# Patient Record
Sex: Male | Born: 1965 | Race: Black or African American | Hispanic: No | Marital: Married | Smoking: Never smoker
Health system: Southern US, Community
[De-identification: ages and names within clinical notes are randomized; demographics above are authoritative.]

## PROBLEM LIST (undated history)

## (undated) DIAGNOSIS — M1990 Unspecified osteoarthritis, unspecified site: Secondary | ICD-10-CM

## (undated) DIAGNOSIS — T7840XA Allergy, unspecified, initial encounter: Secondary | ICD-10-CM

## (undated) DIAGNOSIS — I10 Essential (primary) hypertension: Secondary | ICD-10-CM

## (undated) DIAGNOSIS — M109 Gout, unspecified: Secondary | ICD-10-CM

## (undated) DIAGNOSIS — E119 Type 2 diabetes mellitus without complications: Secondary | ICD-10-CM

## (undated) DIAGNOSIS — K219 Gastro-esophageal reflux disease without esophagitis: Secondary | ICD-10-CM

## (undated) DIAGNOSIS — G4730 Sleep apnea, unspecified: Secondary | ICD-10-CM

## (undated) DIAGNOSIS — E785 Hyperlipidemia, unspecified: Secondary | ICD-10-CM

## (undated) HISTORY — PX: KNEE ARTHROSCOPY: SUR90

## (undated) HISTORY — DX: Essential (primary) hypertension: I10

## (undated) HISTORY — DX: Sleep apnea, unspecified: G47.30

## (undated) HISTORY — DX: Hyperlipidemia, unspecified: E78.5

## (undated) HISTORY — DX: Allergy, unspecified, initial encounter: T78.40XA

## (undated) HISTORY — DX: Gout, unspecified: M10.9

## (undated) HISTORY — DX: Gastro-esophageal reflux disease without esophagitis: K21.9

## (undated) HISTORY — DX: Type 2 diabetes mellitus without complications: E11.9

---

## 2015-11-07 ENCOUNTER — Telehealth: Payer: Self-pay

## 2015-11-07 NOTE — Telephone Encounter (Signed)
Spoke with pt. He will have either the pharmacy or his last dr to send over a medication list.

## 2015-11-07 NOTE — Telephone Encounter (Signed)
Patient called and wanted to know if his medications could be filled. He has an app on 9/21 to est care. He did not have the name of the medications on him at the time. I informed we could ask. But he may have to go to urgent care to take care of his until his app. Hes other doctor will no fill them. He came from [place]. Please advise or follow up.

## 2015-11-14 ENCOUNTER — Telehealth: Payer: Self-pay | Admitting: Emergency Medicine

## 2015-11-14 MED ORDER — CARVEDILOL 6.25 MG PO TABS: 6.2500 mg | ORAL_TABLET | Freq: Two times a day (BID) | ORAL | Status: DC

## 2015-11-14 MED ORDER — AMLODIPINE BESYLATE 5 MG PO TABS: 5.0000 mg | ORAL_TABLET | Freq: Every day | ORAL | Status: DC

## 2015-11-14 MED ORDER — SAXAGLIPTIN-METFORMIN ER 5-1000 MG PO TB24: ORAL_TABLET | ORAL | Status: DC

## 2015-11-14 MED ORDER — QUINAPRIL HCL 20 MG PO TABS: 20.0000 mg | ORAL_TABLET | Freq: Two times a day (BID) | ORAL | Status: DC

## 2015-11-14 NOTE — Telephone Encounter (Signed)
Yes, ok to fill - just enough to get him to the appt.

## 2015-11-14 NOTE — Telephone Encounter (Signed)
RXs sent to [facility] on file. Pt informed.

## 2015-11-14 NOTE — Telephone Encounter (Signed)
Please advise if you are okay with filling pts meds before being seen in September. Received med list from Pharmacy. Meds entered.

## 2015-11-14 NOTE — Telephone Encounter (Signed)
Received med list from [facility], entered in computer.

## 2015-12-30 ENCOUNTER — Encounter: Payer: Self-pay | Admitting: Internal Medicine

## 2015-12-30 ENCOUNTER — Ambulatory Visit (INDEPENDENT_AMBULATORY_CARE_PROVIDER_SITE_OTHER): Payer: 59 | Admitting: Internal Medicine

## 2015-12-30 DIAGNOSIS — M170 Bilateral primary osteoarthritis of knee: Secondary | ICD-10-CM | POA: Insufficient documentation

## 2015-12-30 DIAGNOSIS — M2550 Pain in unspecified joint: Secondary | ICD-10-CM

## 2015-12-30 DIAGNOSIS — I10 Essential (primary) hypertension: Secondary | ICD-10-CM | POA: Insufficient documentation

## 2015-12-30 DIAGNOSIS — F32A Depression, unspecified: Secondary | ICD-10-CM | POA: Insufficient documentation

## 2015-12-30 DIAGNOSIS — E119 Type 2 diabetes mellitus without complications: Secondary | ICD-10-CM | POA: Diagnosis not present

## 2015-12-30 DIAGNOSIS — G4733 Obstructive sleep apnea (adult) (pediatric): Secondary | ICD-10-CM | POA: Insufficient documentation

## 2015-12-30 DIAGNOSIS — F329 Major depressive disorder, single episode, unspecified: Secondary | ICD-10-CM | POA: Diagnosis not present

## 2015-12-30 MED ORDER — SPIRONOLACTONE 25 MG PO TABS: 25.0000 mg | ORAL_TABLET | Freq: Every day | ORAL | 5 refills | Status: DC

## 2015-12-30 MED ORDER — QUINAPRIL HCL 20 MG PO TABS: 20.0000 mg | ORAL_TABLET | Freq: Two times a day (BID) | ORAL | 5 refills | Status: DC

## 2015-12-30 MED ORDER — AMLODIPINE BESYLATE 5 MG PO TABS: 5.0000 mg | ORAL_TABLET | Freq: Every day | ORAL | 5 refills | Status: DC

## 2015-12-30 MED ORDER — SAXAGLIPTIN-METFORMIN ER 5-1000 MG PO TB24: ORAL_TABLET | ORAL | 5 refills | Status: DC

## 2015-12-30 NOTE — Assessment & Plan Note (Signed)
May need to reevaluate-diagnosis was years ago and at that time he was told he did not need treatment Work on weight loss

## 2015-12-30 NOTE — Assessment & Plan Note (Signed)
Has seen orthopedics in the past-status post bilateral knee arthroscopic Has chronic knee pain and is interested in further evaluation Refer to sports medicine Stressed weight loss Stressed regular exercise

## 2015-12-30 NOTE — Assessment & Plan Note (Signed)
He thinks he has some baseline depression, but wonders if one of his medications is making it worse We'll try switching the Coreg He is interested in seeing a therapist-refer today He would like to hold off on medication for now

## 2015-12-30 NOTE — Assessment & Plan Note (Signed)
Blood pressure elevated here today Advised him to ideally start monitoring regularly He is concerned that Coreg is causing side effects and we will discontinue this Continue amlodipine 5 mg daily Continue Accupril 20 mg twice daily Start spironolactone 20 mg daily Check blood work Follow-up in a few weeks to reassess Decrease sodium intake Start regular exercise and work on weight loss

## 2015-12-30 NOTE — Assessment & Plan Note (Signed)
Check A1c, urine microalbumin Stressed the importance of regular exercise and weight loss She is eating out a lot and stressed the importance of decreasing this and trying to eat more healthy Discussed checking his feet daily and annual eye exams

## 2015-12-30 NOTE — Progress Notes (Signed)
Subjective:    Patient ID: Benjamin Chen, male    DOB: March 03, 1966, 50 y.o.   MRN: [id number]  HPI He is here to establish with a new pcp.   He moved to the area and has not had blood work in over a year.  Hypertension: He is taking his medication daily. He felt better when he did not take it when he initially moved, but continue he had restarted. He has several side effects and thinks it may be related to one or all of his blood pressure medications. He is not compliant with a low sodium diet.  He is not exercising regularly.  He does not monitor his blood pressure at home.    Diabetes: He is taking his medication daily as prescribed. He is somewhat compliant with a diabetic diet. He is not exercising regularly. He monitors his sugars rarely.   He does not check his feet daily.  He is up-to-date with an ophthalmology examination.   Some of his concerns of possible side effects from medication include low libido, depression, joint pain, feeling run down. He is very concerned about the side effects.  Sometimes when he sleeps she wakes up and is not able to breathe. He does feel his throat and was unsure if that this was related to heartburn. He was tested in the past for sleep apnea and was told he had slight sleep apnea and that no treatment was needed. He does have heartburn on occasion.   Medications and allergies reviewed with patient and updated if appropriate.  There are no active problems to display for this patient.   Current Outpatient Prescriptions on File Prior to Visit  Medication Sig Dispense Refill  . amLODipine (NORVASC) 5 MG tablet Take 1 tablet (5 mg total) by mouth daily. 30 tablet 1  . carvedilol (COREG) 6.25 MG tablet Take 1 tablet (6.25 mg total) by mouth 2 (two) times daily with a meal. 60 tablet 1  . quinapril (ACCUPRIL) 20 MG tablet Take 1 tablet (20 mg total) by mouth 2 (two) times daily. 60 tablet 1  . Saxagliptin-Metformin (KOMBIGLYZE XR) 09-998 MG TB24 Take 1  Tablet by Mouth daily with meal 30 tablet 1   No current facility-administered medications on file prior to visit.     Past Medical History:  Diagnosis Date  . Diabetes mellitus without complication ([place])   . Hypertension     History reviewed. No pertinent surgical history.  Social History   Social History  . Marital status: Married    Spouse name: N/A  . Number of children: N/A  . Years of education: N/A   Social History Main Topics  . Smoking status: Never Smoker  . Smokeless tobacco: Never Used  . Alcohol use Yes  . Drug use: No  . Sexual activity: Not Asked   Other Topics Concern  . None   Social History Narrative  . None    Family History  Problem Relation Age of Onset  . Hyperlipidemia Mother   . Hypertension Mother   . Mental illness Father   . Heart disease Maternal Aunt   . Diabetes Maternal Grandmother     Review of Systems  Constitutional: Positive for fatigue (low energy level). Negative for chills and fever.  Eyes: Negative for visual disturbance.  Respiratory: Positive for shortness of breath (with stairs only, not on a flat surface). Negative for cough and wheezing.   Cardiovascular: Positive for chest pain (stabbing in left lateral chest - lasts  few minutes; nuclear stress test done) and palpitations (occasionally). Negative for leg swelling.  Gastrointestinal: Negative for abdominal pain, blood in stool, constipation and diarrhea.       Occ GERD  Genitourinary: Negative for dysuria and hematuria.  Musculoskeletal: Positive for arthralgias (knees, elbows, sometimes toes) and joint swelling (elbow feels inflammed). Negative for back pain.  Neurological: Positive for dizziness (a little), light-headedness (when changes positions) and headaches (occasional).  Psychiatric/Behavioral: Positive for dysphoric mood. The patient is not nervous/anxious.        Objective:   Vitals:   12/30/15 0911  BP: (!) 142/94  Pulse: 88  Resp: 16  Temp: 97.8  F (36.6 C)   Filed Weights   12/30/15 0911  Weight: 283 lb (128.4 kg)   Body mass index is 37.34 kg/m.   Physical Exam Constitutional: He appears well-developed and well-nourished. No distress.  HENT:  Head: Normocephalic and atraumatic.  Right Ear: External ear normal.  Left Ear: External ear normal.  Mouth/Throat: Oropharynx is clear and moist.  Normal ear canals and TM b/l  Eyes: Conjunctivae and EOM are normal.  Neck: Neck supple. No tracheal deviation present. No thyromegaly present.  No carotid bruit  Cardiovascular: Normal rate, regular rhythm, normal heart sounds and intact distal pulses.   No murmur heard. Pulmonary/Chest: Effort normal and breath sounds normal. No respiratory distress. He has no wheezes. He has no rales.  Abdominal: Soft. Bowel sounds are normal. He exhibits no distension. There is no tenderness.  Musculoskeletal: He exhibits no edema.  Lymphadenopathy:    He has no cervical adenopathy.  Skin: Skin is warm and dry. He is not diaphoretic.  Psychiatric: He has a normal mood and affect. His behavior is normal.         Assessment & Plan:   See Problem List for Assessment and Plan of chronic medical problems.

## 2015-12-30 NOTE — Progress Notes (Signed)
Pre visit review using our clinic review tool, if applicable. No additional management support is needed unless otherwise documented below in the visit note. 

## 2015-12-30 NOTE — Assessment & Plan Note (Signed)
Some of his joint pain is likely related to osteoarthritis, but need to rule out autoimmune disease Check ANA, RF, CCP, CRP and ESR

## 2015-12-30 NOTE — Patient Instructions (Signed)
Start regular exercise and work on weight loss.   Get blood work done tomorrow.   Test(s) ordered today. Your results will be released to [place] (or called to you) after review, usually within 72hours after test completion. If any changes need to be made, you will be notified at that same time.  Medications reviewed and updated.  Changes include stopping the carvedilol and start spironolactone 25 mg in the morning.    Your prescription(s) have been submitted to your pharmacy. Please take as directed and contact our office if you believe you are having problem(s) with the medication(s).  A referral for sports medicine, podiatry, a therapist and nutrition.  Please followup in 3 weeks

## 2016-01-17 ENCOUNTER — Other Ambulatory Visit (INDEPENDENT_AMBULATORY_CARE_PROVIDER_SITE_OTHER): Payer: 59

## 2016-01-17 ENCOUNTER — Ambulatory Visit: Payer: 59 | Admitting: Internal Medicine

## 2016-01-17 DIAGNOSIS — E119 Type 2 diabetes mellitus without complications: Secondary | ICD-10-CM | POA: Diagnosis not present

## 2016-01-17 DIAGNOSIS — M2550 Pain in unspecified joint: Secondary | ICD-10-CM

## 2016-01-17 DIAGNOSIS — I10 Essential (primary) hypertension: Secondary | ICD-10-CM | POA: Diagnosis not present

## 2016-01-17 LAB — CBC WITH DIFFERENTIAL/PLATELET
BASOS PCT: 0.4 % (ref 0.0–3.0)
Basophils Absolute: 0 10*3/uL (ref 0.0–0.1)
EOS PCT: 1.4 % (ref 0.0–5.0)
Eosinophils Absolute: 0.1 10*3/uL (ref 0.0–0.7)
HCT: 42.9 % (ref 39.0–52.0)
Hemoglobin: 14.7 g/dL (ref 13.0–17.0)
LYMPHS ABS: 2.4 10*3/uL (ref 0.7–4.0)
Lymphocytes Relative: 34.3 % (ref 12.0–46.0)
MCHC: 34.2 g/dL (ref 30.0–36.0)
MCV: 77.3 fl — AB (ref 78.0–100.0)
MONO ABS: 0.6 10*3/uL (ref 0.1–1.0)
Monocytes Relative: 8.3 % (ref 3.0–12.0)
NEUTROS ABS: 3.9 10*3/uL (ref 1.4–7.7)
NEUTROS PCT: 55.6 % (ref 43.0–77.0)
PLATELETS: 325 10*3/uL (ref 150.0–400.0)
RBC: 5.55 Mil/uL (ref 4.22–5.81)
RDW: 15.3 % (ref 11.5–15.5)
WBC: 6.9 10*3/uL (ref 4.0–10.5)

## 2016-01-17 LAB — COMPREHENSIVE METABOLIC PANEL
ALK PHOS: 71 U/L (ref 39–117)
ALT: 18 U/L (ref 0–53)
AST: 14 U/L (ref 0–37)
Albumin: 3.9 g/dL (ref 3.5–5.2)
BUN: 14 mg/dL (ref 6–23)
CHLORIDE: 105 meq/L (ref 96–112)
CO2: 27 meq/L (ref 19–32)
Calcium: 8.9 mg/dL (ref 8.4–10.5)
Creatinine, Ser: 1.2 mg/dL (ref 0.40–1.50)
GFR: 82.84 mL/min (ref >60.00)
GLUCOSE: 125 mg/dL — AB (ref 70–99)
POTASSIUM: 4 meq/L (ref 3.5–5.1)
SODIUM: 139 meq/L (ref 135–145)
TOTAL PROTEIN: 6.5 g/dL (ref 6.0–8.3)
Total Bilirubin: 0.9 mg/dL (ref 0.2–1.2)

## 2016-01-17 LAB — LIPID PANEL
CHOL/HDL RATIO: 7
Cholesterol: 158 mg/dL (ref 0–200)
HDL: 23.4 mg/dL — AB (ref >39.00)
LDL Cholesterol: 105 mg/dL — ABNORMAL HIGH (ref 0–99)
NonHDL: 134.35
TRIGLYCERIDES: 146 mg/dL (ref 0.0–149.0)
VLDL: 29.2 mg/dL (ref 0.0–40.0)

## 2016-01-17 LAB — TSH: TSH: 2.15 u[IU]/mL (ref 0.35–4.50)

## 2016-01-17 LAB — C-REACTIVE PROTEIN: CRP: 0.8 mg/dL (ref 0.5–20.0)

## 2016-01-17 LAB — HEMOGLOBIN A1C: Hgb A1c MFr Bld: 7.2 % — ABNORMAL HIGH (ref 4.6–6.5)

## 2016-01-17 LAB — MICROALBUMIN / CREATININE URINE RATIO
CREATININE, U: 209.6 mg/dL
MICROALB/CREAT RATIO: 0.6 mg/g (ref 0.0–30.0)
Microalb, Ur: 1.2 mg/dL (ref 0.0–1.9)

## 2016-01-17 LAB — SEDIMENTATION RATE: SED RATE: 1 mm/h (ref 0–15)

## 2016-01-17 LAB — RHEUMATOID FACTOR: Rhuematoid fact SerPl-aCnc: <10 IU/mL (ref <=14)

## 2016-01-20 LAB — ANA: ANA: NEGATIVE

## 2016-01-20 LAB — CYCLIC CITRUL PEPTIDE ANTIBODY, IGG: Cyclic Citrullin Peptide Ab: <16 Units

## 2016-01-23 NOTE — Patient Instructions (Addendum)
  All other Health Maintenance issues reviewed.   All recommended immunizations and age-appropriate screenings are up-to-date or discussed.  No immunizations administered today.   Medications reviewed and updated.  No changes recommended at this time.   Please followup in 6 months, sooner if needed

## 2016-01-23 NOTE — Progress Notes (Signed)
[facility] [place], [place] [address] Phone: [phone] Subjective:    I'm seeing this patient by the request  of:  Binnie Rail, MD   CC: Multiple joint pain, Bilateral knee pain  [id number]  Benjamin Chen is a 50 y.o. male coming in with complaint of joint pains. Patient states He is had this pain for quite some time. Some per Medicare provider and was sent for autoimmune labs as order negative. Past medical history also significant for bilateral knee arthroscopic procedures and has had chronic knee pain since. Patient states that now he is having more and bilateral knee, hip as well as shoulder pain seemed to be worse. Patient states that he just feels that he fatigues too quickly. Affecting some daily activities. States that going up and downstairs has become difficult. Patient's states that also sore at night. Rates severity 7/10.     Past Medical History:  Diagnosis Date  . Diabetes mellitus without complication ([place])   . Hypertension    Past Surgical History:  Procedure Laterality Date  . KNEE ARTHROSCOPY Right    right - torn meniscus  . KNEE ARTHROSCOPY Left    left - torn meniscus   Social History   Social History  . Marital status: Married    Spouse name: N/A  . Number of children: N/A  . Years of education: N/A   Social History Main Topics  . Smoking status: Never Smoker  . Smokeless tobacco: Never Used  . Alcohol use Yes  . Drug use: No  . Sexual activity: Not on file   Other Topics Concern  . Not on file   Social History Narrative  . No narrative on file   Allergies  Allergen Reactions  . Penicillins Swelling   Family History  Problem Relation Age of Onset  . Hyperlipidemia Mother   . Hypertension Mother   . Mental illness Father   . Heart disease Maternal Aunt   . Diabetes Maternal Grandmother     Past medical history, social, surgical and family history all reviewed in electronic medical  record.  No pertanent information unless stated regarding to the chief complaint.   Review of Systems: No headache, visual changes, nausea, vomiting, diarrhea, constipation, dizziness, abdominal pain, skin rash, fevers, chills, night sweats, weight loss, swollen lymph nodes, body aches, joint swelling, muscle aches, chest pain, shortness of breath, mood changes.   Objective  Blood pressure 118/90, pulse 74, height 6\' 1"  (1.854 m), weight 275 lb 12.8 oz (125.1 kg).  General: No apparent distress alert and oriented x3 mood and affect normal, dressed appropriately.  HEENT: Pupils equal, extraocular movements intact  Respiratory: Patient's speak in full sentences and does not appear short of breath  Cardiovascular: No lower extremity edema, non tender, no erythema  Skin: Warm dry intact with no signs of infection or rash on extremities or on axial skeleton.  Abdomen: Soft nontender  Neuro: Cranial nerves II through XII are intact, neurovascularly intact in all extremities with 2+ DTRs and 2+ pulses.  Lymph: No lymphadenopathy of posterior or anterior cervical chain or axillae bilaterally.  Gait normal with good balance and coordination.  MSK:  Non tender with full range of motion and good stability and symmetric strength and tone of shoulders, elbows, wrist, hip and ankles bilaterally. Soreness of all joints but no significant formation. Knee: Bilateral Normal to inspection with no erythema or effusion or obvious bony abnormalities. Generalized soreness.  ROM full in  flexion and extension and lower leg rotation. Ligaments with solid consistent endpoints including ACL, PCL, LCL, MCL. Negative Mcmurray's, Apley's, and Thessalonian tests. Non painful patellar compression. Patellar glide with mild crepitus. Patellar and quadriceps tendons unremarkable. Hamstring and quadriceps strength is normal.       Impression and Recommendations:     This case required medical decision making of moderate  complexity.      Note: This dictation was prepared with Dragon dictation along with smaller phrase technology. Any transcriptional errors that result from this process are unintentional.

## 2016-01-23 NOTE — Progress Notes (Signed)
Subjective:    Patient ID: Benjamin Chen, male    DOB: [birth year], 50 y.o.   MRN: [id number]  HPI The patient is here for follow up.  Joint pain:  We did blood work at his last visit to rule out autoimmune arthritis, which was all negative.  He has seen Dr Tamala Julian and is having additional blood work done.   Hypertension:  He was worried his Coreg was causing side effects and we discontinued it three weeks ago and added spironolactone.  He feels better off the Coreg.  He is taking his medication daily. He is compliant with a low sodium diet.  He denies chest pain, palpitations, edema, shortness of breath and regular headaches. He is not exercising regularly.  He does not monitor his blood pressure at home.  He has had some lightheadedness, but thinks it was related to low sugars from not eating well.  Diabetes: He is taking his medication daily as prescribed. He is not compliant with a diabetic diet. He has not been eating well recently.  He is not exercising regularly. He monitors his sugars and they have been good, but he has had a couple of low sugars, 70.     Medications and allergies reviewed with patient and updated if appropriate.  Patient Active Problem List   Diagnosis Date Noted  . Arthralgia 01/24/2016  . Hyperlipidemia 01/24/2016  . Essential hypertension, benign 12/30/2015  . Diabetes ([place]) 12/30/2015  . OSA (obstructive sleep apnea) 12/30/2015  . Depression 12/30/2015  . Bilateral primary osteoarthritis of knee 12/30/2015  . Joint pain 12/30/2015    Current Outpatient Prescriptions on File Prior to Visit  Medication Sig Dispense Refill  . amLODipine (NORVASC) 5 MG tablet Take 1 tablet (5 mg total) by mouth daily. 30 tablet 5  . quinapril (ACCUPRIL) 20 MG tablet Take 1 tablet (20 mg total) by mouth 2 (two) times daily. 60 tablet 5  . Saxagliptin-Metformin (KOMBIGLYZE XR) 09-998 MG TB24 Take 1 Tablet by Mouth daily with meal 30 tablet 5  . spironolactone (ALDACTONE) 25  MG tablet Take 1 tablet (25 mg total) by mouth daily. 30 tablet 5  . Vitamin D, Ergocalciferol, (DRISDOL) 50000 units CAPS capsule Take 1 capsule (50,000 Units total) by mouth every 7 (seven) days. 12 capsule 0   No current facility-administered medications on file prior to visit.     Past Medical History:  Diagnosis Date  . Diabetes mellitus without complication ([place])   . Hypertension     Past Surgical History:  Procedure Laterality Date  . KNEE ARTHROSCOPY Right    right - torn meniscus  . KNEE ARTHROSCOPY Left    left - torn meniscus    Social History   Social History  . Marital status: Married    Spouse name: N/A  . Number of children: N/A  . Years of education: N/A   Social History Main Topics  . Smoking status: Never Smoker  . Smokeless tobacco: Never Used  . Alcohol use Yes  . Drug use: No  . Sexual activity: Not on file   Other Topics Concern  . Not on file   Social History Narrative  . No narrative on file    Family History  Problem Relation Age of Onset  . Hyperlipidemia Mother   . Hypertension Mother   . Mental illness Father   . Heart disease Maternal Aunt   . Diabetes Maternal Grandmother     Review of Systems  Constitutional: Negative for chills  and fever.  Respiratory: Negative for cough, shortness of breath and wheezing.   Cardiovascular: Negative for chest pain, palpitations and leg swelling.  Musculoskeletal: Positive for arthralgias.  Neurological: Positive for dizziness (occasionally) and light-headedness (occaisonally). Negative for headaches.       Objective:   Vitals:   01/24/16 1116  BP: 106/80  Pulse: 76  Resp: 16  Temp: 97.9 F (36.6 C)   Filed Weights   01/24/16 1116  Weight: 275 lb (124.7 kg)   Body mass index is 36.28 kg/m.   Physical Exam    Constitutional: Appears well-developed and well-nourished. No distress.  HENT:  Head: Normocephalic and atraumatic.  Neck: Neck supple. No tracheal deviation present.  No thyromegaly present.  Cardiovascular: Normal rate, regular rhythm and normal heart sounds.   No murmur heard. No carotid bruit  Pulmonary/Chest: Effort normal and breath sounds normal. No respiratory distress. No has no wheezes. No rales.  Musculoskeletal: No edema.  Lymphadenopathy: No cervical adenopathy.  Skin: Skin is warm and dry. Not diaphoretic.  Psychiatric: Normal mood and affect. Behavior is normal.     Assessment & Plan:    See Problem List for Assessment and Plan of chronic medical problems.   F/u in 6 months

## 2016-01-24 ENCOUNTER — Encounter: Payer: Self-pay | Admitting: Family Medicine

## 2016-01-24 ENCOUNTER — Ambulatory Visit (INDEPENDENT_AMBULATORY_CARE_PROVIDER_SITE_OTHER): Payer: 59 | Admitting: Internal Medicine

## 2016-01-24 ENCOUNTER — Encounter: Payer: Self-pay | Admitting: Internal Medicine

## 2016-01-24 ENCOUNTER — Other Ambulatory Visit (INDEPENDENT_AMBULATORY_CARE_PROVIDER_SITE_OTHER): Payer: 59

## 2016-01-24 ENCOUNTER — Ambulatory Visit (INDEPENDENT_AMBULATORY_CARE_PROVIDER_SITE_OTHER): Payer: 59 | Admitting: Family Medicine

## 2016-01-24 VITALS — BP 118/90 | HR 74 | Ht 73.0 in | Wt 275.8 lb

## 2016-01-24 VITALS — BP 106/80 | HR 76 | Temp 97.9°F | Resp 16 | Wt 275.0 lb

## 2016-01-24 DIAGNOSIS — M2550 Pain in unspecified joint: Secondary | ICD-10-CM | POA: Diagnosis not present

## 2016-01-24 DIAGNOSIS — E785 Hyperlipidemia, unspecified: Secondary | ICD-10-CM

## 2016-01-24 DIAGNOSIS — I10 Essential (primary) hypertension: Secondary | ICD-10-CM

## 2016-01-24 DIAGNOSIS — E119 Type 2 diabetes mellitus without complications: Secondary | ICD-10-CM | POA: Diagnosis not present

## 2016-01-24 DIAGNOSIS — M109 Gout, unspecified: Secondary | ICD-10-CM

## 2016-01-24 LAB — URIC ACID: URIC ACID, SERUM: 8.4 mg/dL — AB (ref 4.0–7.8)

## 2016-01-24 LAB — T3, FREE: T3 FREE: 4.2 pg/mL (ref 2.3–4.2)

## 2016-01-24 LAB — T4, FREE: Free T4: 0.93 ng/dL (ref 0.60–1.60)

## 2016-01-24 LAB — FERRITIN: FERRITIN: 81.1 ng/mL (ref 22.0–322.0)

## 2016-01-24 LAB — VITAMIN D 25 HYDROXY (VIT D DEFICIENCY, FRACTURES): VITD: 19.31 ng/mL — AB (ref 30.00–100.00)

## 2016-01-24 MED ORDER — VITAMIN D (ERGOCALCIFEROL) 1.25 MG (50000 UNIT) PO CAPS: 50000.0000 [IU] | ORAL_CAPSULE | ORAL | 0 refills | Status: DC

## 2016-01-24 NOTE — Assessment & Plan Note (Addendum)
BP on low side today, he has not been monitoring it  He feels lightheadedness is related to low sugars He will start monitoring BP at home -- if low will decrease spironolactone to 12.5 mg daily Work on weight loss - he has lost some weight Start regular exercise Improve diet F/u in 6 months, sooner if needed

## 2016-01-24 NOTE — Assessment & Plan Note (Signed)
Patient is having a significant amount arthralgia. Possibly some mild myositis as well. Patient is a diabetic that does increase the risk of this. Patient has had labs that is ruled out autoimmune diseases or any type of inflammatory response. I would like to check patient's testosterone, uric acid as well as iron and vitamin D that could be also contributing to chronic joint pain. We encouraged patient to stay active. We discussed icing regimen and home exercises. We discussed which activities to do an which was potentially avoid. Patient will come back and see me again in 3-4 weeks.

## 2016-01-24 NOTE — Progress Notes (Signed)
Pre visit review using our clinic review tool, if applicable. No additional management support is needed unless otherwise documented below in the visit note. 

## 2016-01-24 NOTE — Patient Instructions (Addendum)
Good to see you  Lets try some other labs to see what is going on.  Once weekly vitamin D for next 12 weeks.  Turmeric 500mg  twice daily  Tart cherry extract at night any dose.  Stay active, your body wants to move.  See me again in 3-4 weeks and we will see how you are doing.

## 2016-01-24 NOTE — Assessment & Plan Note (Signed)
He is a diabetic and LDL goal is less than 70 - he is not at goal  He is having joint and muscle pain so will hold off on statin for now Stressed improved diet, exercise and weight loss Recheck lipid panel in 6 months

## 2016-01-24 NOTE — Assessment & Plan Note (Signed)
Last a1c 7.2 He is working on improving his diet and weight loss Will try to start exercising Has been referred to nutrition Continue current medication  Advised him to call or return if he has frequent low sugars F/u in 6 months

## 2016-01-26 LAB — TESTOSTERONE, FREE, TOTAL, SHBG
Sex Hormone Binding: 28.3 nmol/L (ref 16.5–55.9)
TESTOSTERONE: 318 ng/dL (ref 264–916)
Testosterone, Free: 14.2 pg/mL (ref 6.8–21.5)

## 2016-01-29 ENCOUNTER — Encounter: Payer: Self-pay | Admitting: Family Medicine

## 2016-01-29 DIAGNOSIS — M109 Gout, unspecified: Secondary | ICD-10-CM | POA: Insufficient documentation

## 2016-01-29 MED ORDER — ALLOPURINOL 100 MG PO TABS: 200.0000 mg | ORAL_TABLET | Freq: Every day | ORAL | 1 refills | Status: DC

## 2016-01-29 NOTE — Progress Notes (Signed)
Entered in error

## 2016-01-30 ENCOUNTER — Ambulatory Visit: Payer: Self-pay | Admitting: Internal Medicine

## 2016-02-24 ENCOUNTER — Encounter: Payer: Self-pay | Admitting: Dietician

## 2016-02-24 ENCOUNTER — Ambulatory Visit (INDEPENDENT_AMBULATORY_CARE_PROVIDER_SITE_OTHER): Payer: 59 | Admitting: Podiatry

## 2016-02-24 ENCOUNTER — Encounter: Payer: 59 | Attending: Internal Medicine | Admitting: Dietician

## 2016-02-24 DIAGNOSIS — E119 Type 2 diabetes mellitus without complications: Secondary | ICD-10-CM

## 2016-02-24 DIAGNOSIS — L853 Xerosis cutis: Secondary | ICD-10-CM

## 2016-02-24 NOTE — Progress Notes (Signed)
   Subjective:    Patient ID: Benjamin Chen, male    DOB: 1966/05/06, 50 y.o.   MRN: [id number]  HPI    Review of Systems  All other systems reviewed and are negative.      Objective:   Physical Exam        Assessment & Plan:

## 2016-02-24 NOTE — Progress Notes (Signed)
Diabetes Self-Management Education  Visit Type: First/Initial  Appt. Start Time: 0830 Appt. End Time: [id number] Patient arrived late  02/24/2016  [name], identified by name and date of birth, is a 50 y.o. male with a diagnosis of Diabetes: Type 2. Labs include A1C of 7.2% 01/17/16, cholesterol 158, triglycerides 146, HDL 23, LDL 105, vitamin D 19.3 on 01/24/16.  Other hx includes HTN, OSA, depression hyperlipidemia, and gout.  Medications include Saxagliptin-Metformin, spironolactone, and vitamin D.  Patient lives with his wife.  I saw her in my office in the past and he was present.  Patient states that his wife has changed many things about her diet to try to eat more healthfully but that they do not have time/energy to cook.  He is resentful about his wife when he does cook that he is the only one doing this and other chores.  They do grocery shop together but he eats most of his meals out.  Patient states that he is going to see someone about depression this week.  I also suggested considering marriage counseling so he and his wife work together more as a team to improve their health  (based on today's conversation and previous visit with his wife).  He moved from [facility] about 1 year ago.  7 years ago he was 238 lbs and lost this by walking and eating lighter but regained this due to increased junk food intake when he became depressed.  He works as a Medical illustrator.  He does walk his dog and has an exercise bike but does not use.  There is also a gym in the apartment complex but he does not use this.    ASSESSMENT  Height 6' (1.829 m), weight 276 lb (125.2 kg). Body mass index is 37.43 kg/m.      Diabetes Self-Management Education - 02/24/16 0829      Visit Information   Visit Type First/Initial     Initial Visit   Diabetes Type Type 2   Are you currently following a meal plan? No   Are you taking your medications as prescribed? Yes   Date Diagnosed 10-15 years ago     Health Coping   How would you rate your overall health? Good     Psychosocial Assessment   Patient Belief/Attitude about Diabetes Motivated to manage diabetes  but burned out and don't want it anymore   Self-care barriers None   Self-management support Doctor's office   Other persons present Patient   Patient Concerns Nutrition/Meal planning;Glycemic Control   Special Needs None   Preferred Learning Style No preference indicated   Learning Readiness Ready   How often do you need to have someone help you when you read instructions, pamphlets, or other written materials from your doctor or pharmacy? 1 - Never   What is the last grade level you completed in school? 2 years college     Pre-Education Assessment   Patient understands the diabetes disease and treatment process. Needs Review   Patient understands incorporating nutritional management into lifestyle. Needs Instruction   Patient undertands incorporating physical activity into lifestyle. Needs Review   Patient understands using medications safely. Demonstrates understanding / competency   Patient understands monitoring blood glucose, interpreting and using results Needs Review   Patient understands prevention, detection, and treatment of acute complications. Needs Review   Patient understands prevention, detection, and treatment of chronic complications. Needs Review   Patient understands how to develop strategies to address psychosocial issues. Needs  Review   Patient understands how to develop strategies to promote health/change behavior. Needs Review     Complications   Last HgB A1C per patient/outside source 7.2 %  01/17/16   How often do you check your blood sugar? 0 times/day (not testing)  "If I don't know then it won't hurt me."   Have you had a dilated eye exam in the past 12 months? No  "due for it"   Have you had a dental exam in the past 12 months? Yes   Are you checking your feet? Yes   How many days per week are  you checking your feet? 7     Dietary Intake   Breakfast skips at times OR instant oatmeal and tangerine or apple and Coffee with powdered cream and splenda or occasional sugar   Snack (morning) "what ever is laying around at work" OR Biomedical engineer (chips, candy)   Lunch leftovers OR cafe ("salty special of the day")   Snack (afternoon) onlyl if he did not have a morning snack   Dinner meat or fish with fries, vegetables OR [place] OR fish sticks and crab cakes OR [place] food OR burger and fries   Snack (evening) chips or humus or cookies if it is there   Beverage(s) sweet tea, diet tea, coffee with splenda or sugar and cream, water, juice, diet soda     Exercise   Exercise Type ADL's  gym in apartment complex   How many days per week to you exercise? 0   How many minutes per day do you exercise? 0   Total minutes per week of exercise 0     Patient Education   Previous Diabetes Education Yes (please comment)  several years ago and can't remember   Disease state  Definition of diabetes, type 1 and 2, and the diagnosis of diabetes;Factors that contribute to the development of diabetes;Explored patient's options for treatment of their diabetes   Nutrition management  Role of diet in the treatment of diabetes and the relationship between the three main macronutrients and blood glucose level;Food label reading, portion sizes and measuring food.;Information on hints to eating out and maintain blood glucose control.;Meal options for control of blood glucose level and chronic complications.   Physical activity and exercise  Role of exercise on diabetes management, blood pressure control and cardiac health.   Monitoring Purpose and frequency of SMBG.;Identified appropriate SMBG and/or A1C goals.;Yearly dilated eye exam;Daily foot exams   Acute complications Taught treatment of hypoglycemia - the 15 rule.   Chronic complications Relationship between chronic complications and blood glucose  control;Assessed and discussed foot care and prevention of foot problems;Retinopathy and reason for yearly dilated eye exams   Psychosocial adjustment Role of stress on diabetes;Worked with patient to identify barriers to care and solutions;Helped patient identify a support system for diabetes management;Identified and addressed patients feelings and concerns about diabetes;Brainstormed with patient on coping mechanisms for social situations, getting support from significant others, dealing with feelings about diabetes   Personal strategies to promote health Lifestyle issues that need to be addressed for better diabetes care     Individualized Goals (developed by patient)   Nutrition General guidelines for healthy choices and portions discussed   Physical Activity Exercise 5-7 days per week;30 minutes per day   Medications take my medication as prescribed   Monitoring  test my blood glucose as discussed   Reducing Risk examine blood glucose patterns;do foot checks daily     Post-Education Assessment  Patient understands the diabetes disease and treatment process. Demonstrates understanding / competency   Patient understands incorporating nutritional management into lifestyle. Demonstrates understanding / competency   Patient undertands incorporating physical activity into lifestyle. Demonstrates understanding / competency   Patient understands using medications safely. Demonstrates understanding / competency   Patient understands monitoring blood glucose, interpreting and using results Demonstrates understanding / competency   Patient understands prevention, detection, and treatment of acute complications. Demonstrates understanding / competency   Patient understands prevention, detection, and treatment of chronic complications. Demonstrates understanding / competency   Patient understands how to develop strategies to address psychosocial issues. Demonstrates understanding / competency   Patient  understands how to develop strategies to promote health/change behavior. Demonstrates understanding / competency     Outcomes   Expected Outcomes Demonstrated interest in learning. Expect positive outcomes   Future DMSE PRN   Program Status Completed      Individualized Plan for Diabetes Self-Management Training:   Learning Objective:  Patient will have a greater understanding of diabetes self-management. Patient education plan is to attend individual and/or group sessions per assessed needs and concerns.   Plan:   Patient Instructions  Be as active as possible.  Aim for 30 minutes most days.  Bike, walk, go to the gym. Make healthy choices, eat slowly, stop when you are satisfied. Before eating a snack ask, "Am I hungry?"  If you aren't, what could you do instead. Rethink what you drink. Consider planning easy meals. Avoid skipping meals.  Aim for 3-4 Carb Choices per meal (45-60 grams) +/- 1 either way  Aim for 0-15 Carbs per snack if hungry  Include protein in moderation with your meals and snacks Consider reading food labels for Total Carbohydrate and Fat Grams of foods Consider checking BG at alternate times per day as directed by MD  Consider taking medication as directed by MD    Expected Outcomes:  Demonstrated interest in learning. Expect positive outcomes  Education material provided: Food label handouts, A1C conversion sheet, Meal plan card, My Plate and Snack sheet  If problems or questions, patient to contact team via:  Phone and Email  Future DSME appointment: PRN

## 2016-02-24 NOTE — Patient Instructions (Signed)
Be as active as possible.  Aim for 30 minutes most days.  Bike, walk, go to the gym. Make healthy choices, eat slowly, stop when you are satisfied. Before eating a snack ask, "Am I hungry?"  If you aren't, what could you do instead. Rethink what you drink. Consider planning easy meals. Avoid skipping meals.  Aim for 3-4 Carb Choices per meal (45-60 grams) +/- 1 either way  Aim for 0-15 Carbs per snack if hungry  Include protein in moderation with your meals and snacks Consider reading food labels for Total Carbohydrate and Fat Grams of foods Consider checking BG at alternate times per day as directed by MD  Consider taking medication as directed by MD

## 2016-02-25 ENCOUNTER — Ambulatory Visit (INDEPENDENT_AMBULATORY_CARE_PROVIDER_SITE_OTHER): Payer: 59 | Admitting: Licensed Clinical Social Worker

## 2016-02-25 DIAGNOSIS — F331 Major depressive disorder, recurrent, moderate: Secondary | ICD-10-CM | POA: Diagnosis not present

## 2016-03-01 NOTE — Progress Notes (Signed)
Patient ID: Benjamin Chen, male   DOB: 09/23/1965, 50 y.o.   MRN: [id number] Subjective:  Patient presents today for lower extremity evaluation and exam. Patient states that he was diagnosed with diabetes approximately 15 years ago. Patient presents today for routine diabetic lower extremity exam.    Objective/Physical Exam General: The patient is alert and oriented x3 in no acute distress.  Dermatology: Xerosis noted to the bilateral lower extremity feet with mild hyperkeratotic skin. Skin is warm, dry and supple bilateral lower extremities. Negative for open lesions or macerations.  Vascular: Palpable pedal pulses bilaterally. No edema or erythema noted. Capillary refill within normal limits.  Neurological: Epicritic and protective threshold grossly intact bilaterally.   Musculoskeletal Exam: Range of motion within normal limits to all pedal and ankle joints bilateral. Muscle strength 5/5 in all groups bilateral.   Radiographic Exam:  Normal osseous mineralization. Joint spaces preserved. No fracture/dislocation/boney destruction.    Assessment: #1 diabetes mellitus type II #2 xerosis bilateral feet   Plan of Care:  #1 Patient was evaluated. #2 patient satisfactory in all aspects of musculoskeletal, vascular, dermatological and neurological exam. #3 recommend AmLactin cream #4 patient is to return to the clinic annually for routine lower extremity diabetic exam.   Dr. Edrick Kins, [place]

## 2016-04-02 ENCOUNTER — Other Ambulatory Visit: Payer: Self-pay | Admitting: Family Medicine

## 2016-04-06 NOTE — Telephone Encounter (Signed)
Refill done.  

## 2016-05-03 ENCOUNTER — Other Ambulatory Visit: Payer: Self-pay | Admitting: Family Medicine

## 2016-06-04 ENCOUNTER — Other Ambulatory Visit: Payer: Self-pay | Admitting: Internal Medicine

## 2016-07-13 ENCOUNTER — Other Ambulatory Visit: Payer: Self-pay | Admitting: Internal Medicine

## 2016-07-14 ENCOUNTER — Other Ambulatory Visit: Payer: Self-pay | Admitting: Internal Medicine

## 2016-08-02 ENCOUNTER — Other Ambulatory Visit: Payer: Self-pay | Admitting: Family Medicine

## 2016-08-04 ENCOUNTER — Other Ambulatory Visit: Payer: Self-pay | Admitting: Internal Medicine

## 2016-09-07 ENCOUNTER — Other Ambulatory Visit: Payer: Self-pay | Admitting: Internal Medicine

## 2016-09-12 ENCOUNTER — Other Ambulatory Visit: Payer: Self-pay | Admitting: Internal Medicine

## 2016-09-20 ENCOUNTER — Other Ambulatory Visit: Payer: Self-pay | Admitting: Internal Medicine

## 2016-10-02 ENCOUNTER — Other Ambulatory Visit: Payer: Self-pay | Admitting: Family Medicine

## 2016-10-02 ENCOUNTER — Other Ambulatory Visit: Payer: Self-pay | Admitting: Internal Medicine

## 2016-10-06 ENCOUNTER — Other Ambulatory Visit: Payer: Self-pay | Admitting: Family Medicine

## 2016-10-06 MED ORDER — VITAMIN D (ERGOCALCIFEROL) 1.25 MG (50000 UNIT) PO CAPS: 50000.0000 [IU] | ORAL_CAPSULE | ORAL | 0 refills | Status: DC

## 2016-10-06 MED ORDER — AMLODIPINE BESYLATE 5 MG PO TABS: 5.0000 mg | ORAL_TABLET | Freq: Every day | ORAL | 0 refills | Status: DC

## 2016-10-06 MED ORDER — SAXAGLIPTIN-METFORMIN ER 5-1000 MG PO TB24: ORAL_TABLET | ORAL | 0 refills | Status: DC

## 2016-10-07 ENCOUNTER — Ambulatory Visit (INDEPENDENT_AMBULATORY_CARE_PROVIDER_SITE_OTHER): Payer: BLUE CROSS/BLUE SHIELD | Admitting: Internal Medicine

## 2016-10-07 ENCOUNTER — Other Ambulatory Visit (INDEPENDENT_AMBULATORY_CARE_PROVIDER_SITE_OTHER): Payer: BLUE CROSS/BLUE SHIELD

## 2016-10-07 ENCOUNTER — Other Ambulatory Visit: Payer: Self-pay | Admitting: *Deleted

## 2016-10-07 ENCOUNTER — Encounter: Payer: Self-pay | Admitting: Internal Medicine

## 2016-10-07 VITALS — BP 144/98 | HR 75 | Temp 98.6°F | Resp 16 | Ht 72.0 in | Wt 288.0 lb

## 2016-10-07 DIAGNOSIS — N529 Male erectile dysfunction, unspecified: Secondary | ICD-10-CM

## 2016-10-07 DIAGNOSIS — G4733 Obstructive sleep apnea (adult) (pediatric): Secondary | ICD-10-CM | POA: Diagnosis not present

## 2016-10-07 DIAGNOSIS — E119 Type 2 diabetes mellitus without complications: Secondary | ICD-10-CM

## 2016-10-07 DIAGNOSIS — I10 Essential (primary) hypertension: Secondary | ICD-10-CM

## 2016-10-07 DIAGNOSIS — R079 Chest pain, unspecified: Secondary | ICD-10-CM | POA: Diagnosis not present

## 2016-10-07 DIAGNOSIS — M109 Gout, unspecified: Secondary | ICD-10-CM | POA: Diagnosis not present

## 2016-10-07 DIAGNOSIS — E7800 Pure hypercholesterolemia, unspecified: Secondary | ICD-10-CM

## 2016-10-07 MED ORDER — QUINAPRIL HCL 20 MG PO TABS: 20.0000 mg | ORAL_TABLET | Freq: Two times a day (BID) | ORAL | 3 refills | Status: DC

## 2016-10-07 MED ORDER — AMLODIPINE BESYLATE 5 MG PO TABS: 5.0000 mg | ORAL_TABLET | Freq: Every day | ORAL | 3 refills | Status: DC

## 2016-10-07 MED ORDER — SPIRONOLACTONE 25 MG PO TABS: 25.0000 mg | ORAL_TABLET | Freq: Every day | ORAL | 3 refills | Status: DC

## 2016-10-07 MED ORDER — SAXAGLIPTIN-METFORMIN ER 5-1000 MG PO TB24: ORAL_TABLET | ORAL | 1 refills | Status: DC

## 2016-10-07 NOTE — Assessment & Plan Note (Signed)
No flares Check uric acid level Continue allopurinol

## 2016-10-07 NOTE — Patient Instructions (Addendum)
BP goal is < 130/80.  Monitor at home regularly.  Call or return if not controlled.   Look into the weight loss management program: [email or web address]   Test(s) ordered today. Your results will be released to Benjamin Chen (or called to you) after review, usually within 72hours after test completion. If any changes need to be made, you will be notified at that same time.    Medications reviewed and updated.  No changes recommended at this time.  Your prescription(s) have been submitted to your pharmacy. Please take as directed and contact our office if you believe you are having problem(s) with the medication(s).  A referral was ordered for cardiology, urology and pulmonary for sleep apnea    Please followup in 3 months

## 2016-10-07 NOTE — Assessment & Plan Note (Signed)
Not fasting Will hold off on checking Work on lifestyle Lipids only slightly elevated

## 2016-10-07 NOTE — Assessment & Plan Note (Signed)
Having choking episodes at night while sleeping Need to re-evaluate OSA Refer to pulm

## 2016-10-07 NOTE — Progress Notes (Signed)
Subjective:    Patient ID: Benjamin Chen, male    DOB: 05/19/1965, 51 y.o.   MRN: [id number]  HPI The patient is here for follow up.  Diabetes: He is taking his medication daily as prescribed. He is not always compliant with a diabetic diet - he eats out too much. He is not exercising regularly.   Hypertension: He is not taking his medication daily - he has not taken it for a few days - he needs refill. He is compliant with a low sodium diet.  He denies  palpitations, edema, regular shortness of breath and regular headaches. He is not exercising regularly.  He does monitor his blood pressure at home occasionally  - it was better than here now - he thinks it was controlled.    H/o gout:  He is taking allopurinol daily.  He denies flares.    He has had episodes of left sided chest pain that was associated with shortness of breath, headache.  He denies lightheadedness and palpitations.  This occurs occasionally.  It can occur at rest. It occurs more frequently.  He had a nuclear stress test at that time, but never followed up and does not know the results.  It can last 10 sec to longer.   GERD:  He eats certain foods he will have reflux/GERD and will throw up.  Then he is fine. It feels like it may get stuck in his esophagus.  He can continue to eat.  He does have GERD when laying down or eating a certain foods - this occurs only 3/month.     1-2 times a year when he sleeps he wakes up and can't breath - he is drowning in his saliva.  He has known mild sleep apnea.  He was told he did not need cpap.   Medications and allergies reviewed with patient and updated if appropriate.  Patient Active Problem List   Diagnosis Date Noted  . Gout 01/29/2016  . Arthralgia 01/24/2016  . Hyperlipidemia 01/24/2016  . Essential hypertension, benign 12/30/2015  . Diabetes ([place]) 12/30/2015  . OSA (obstructive sleep apnea) 12/30/2015  . Depression 12/30/2015  . Bilateral primary osteoarthritis of knee  12/30/2015  . Joint pain 12/30/2015    Current Outpatient Prescriptions on File Prior to Visit  Medication Sig Dispense Refill  . allopurinol (ZYLOPRIM) 100 MG tablet TAKE 2 TABLETS BY MOUTH EVERY DAY 60 tablet 5  . amLODipine (NORVASC) 5 MG tablet Take 1 tablet (5 mg total) by mouth daily. Must keep May 30th appt for future refills 30 tablet 0  . quinapril (ACCUPRIL) 20 MG tablet TAKE 1 TABLET BY MOUTH TWICE DAILY. 60 tablet 5  . Saxagliptin-Metformin (KOMBIGLYZE XR) 09-998 MG TB24 Take 1 tablet every day with meal Must keep May 30th appt for future refills 30 tablet 0  . spironolactone (ALDACTONE) 25 MG tablet TAKE 1 TABLET BY MOUTH DAILY. 30 tablet 2  . TURMERIC CURCUMIN PO Take by mouth.    . Vitamin D, Ergocalciferol, (DRISDOL) 50000 units CAPS capsule Take 1 capsule (50,000 Units total) by mouth every 7 (seven) days. 12 capsule 0   No current facility-administered medications on file prior to visit.     Past Medical History:  Diagnosis Date  . Diabetes mellitus without complication ([place])   . Hyperlipidemia   . Hypertension     Past Surgical History:  Procedure Laterality Date  . KNEE ARTHROSCOPY Right    right - torn meniscus  . KNEE ARTHROSCOPY  Left    left - torn meniscus    Social History   Social History  . Marital status: Married    Spouse name: N/A  . Number of children: N/A  . Years of education: N/A   Social History Main Topics  . Smoking status: Never Smoker  . Smokeless tobacco: Never Used  . Alcohol use Yes  . Drug use: No  . Sexual activity: Not on file   Other Topics Concern  . Not on file   Social History Narrative  . No narrative on file    Family History  Problem Relation Age of Onset  . Hyperlipidemia Mother   . Hypertension Mother   . Mental illness Father   . Heart disease Maternal Aunt   . Diabetes Maternal Grandmother     Review of Systems  Constitutional: Negative for chills and fever.  Respiratory: Positive for shortness of  breath (occurs with chest pain). Negative for cough and wheezing.   Cardiovascular: Positive for chest pain (left sided with episodes). Negative for palpitations and leg swelling.  Neurological: Positive for headaches (with episodes). Negative for light-headedness.       Objective:   Vitals:   10/07/16 1621  BP: (!) 144/98  Pulse: 75  Resp: 16  Temp: 98.6 F (37 C)   Wt Readings from Last 3 Encounters:  10/07/16 288 lb (130.6 kg)  02/24/16 276 lb (125.2 kg)  01/24/16 275 lb (124.7 kg)   Body mass index is 39.06 kg/m.   Physical Exam    Constitutional: Appears well-developed and well-nourished. No distress.  HENT:  Head: Normocephalic and atraumatic.  Neck: Neck supple. No tracheal deviation present. No thyromegaly present.  No cervical lymphadenopathy Cardiovascular: Normal rate, regular rhythm and normal heart sounds.   No murmur heard. No carotid bruit .  No edema Pulmonary/Chest: Effort normal and breath sounds normal. No respiratory distress. No has no wheezes. No rales.  Skin: Skin is warm and dry. Not diaphoretic.  Psychiatric: Normal mood and affect. Behavior is normal.      Assessment & Plan:    See Problem List for Assessment and Plan of chronic medical problems.

## 2016-10-07 NOTE — Assessment & Plan Note (Signed)
Episodes of left sided chest pain assoc with sob and ha's Had testing years ago - results unknown Refer to cardiology

## 2016-10-07 NOTE — Assessment & Plan Note (Signed)
Check a1c Low sugar / carb diet Stressed regular exercise,  weight loss F/u in 3 months

## 2016-10-07 NOTE — Assessment & Plan Note (Signed)
?   Controlled - has been off of medications Restart meds Monitor BP at home - goal < 130/80 Stressed exercise, weight loss cmp

## 2016-10-07 NOTE — Assessment & Plan Note (Signed)
He wonders about his testosterone level - it was on the low side, but not done first thing in the morning - also not likely the cause of ED Will refer to urology for further evaluation Stressed good BP, DM control and weight loss

## 2016-10-08 ENCOUNTER — Encounter: Payer: Self-pay | Admitting: Internal Medicine

## 2016-10-08 LAB — COMPREHENSIVE METABOLIC PANEL
ALBUMIN: 4.2 g/dL (ref 3.5–5.2)
ALT: 28 U/L (ref 0–53)
AST: 19 U/L (ref 0–37)
Alkaline Phosphatase: 91 U/L (ref 39–117)
BUN: 16 mg/dL (ref 6–23)
CALCIUM: 9.9 mg/dL (ref 8.4–10.5)
CHLORIDE: 101 meq/L (ref 96–112)
CO2: 29 mEq/L (ref 19–32)
Creatinine, Ser: 1.18 mg/dL (ref 0.40–1.50)
GFR: 84.21 mL/min (ref >60.00)
Glucose, Bld: 123 mg/dL — ABNORMAL HIGH (ref 70–99)
POTASSIUM: 4.4 meq/L (ref 3.5–5.1)
SODIUM: 137 meq/L (ref 135–145)
Total Bilirubin: 0.6 mg/dL (ref 0.2–1.2)
Total Protein: 7.2 g/dL (ref 6.0–8.3)

## 2016-10-08 LAB — HEMOGLOBIN A1C: HEMOGLOBIN A1C: 7.6 % — AB (ref 4.6–6.5)

## 2016-10-08 LAB — URIC ACID: URIC ACID, SERUM: 5.5 mg/dL (ref 4.0–7.8)

## 2016-10-09 NOTE — Telephone Encounter (Signed)
Discussed this with pt over the phone

## 2016-10-20 ENCOUNTER — Ambulatory Visit (INDEPENDENT_AMBULATORY_CARE_PROVIDER_SITE_OTHER): Payer: BLUE CROSS/BLUE SHIELD | Admitting: Cardiovascular Disease

## 2016-10-20 ENCOUNTER — Encounter: Payer: Self-pay | Admitting: Cardiovascular Disease

## 2016-10-20 VITALS — BP 130/100 | HR 76 | Ht 72.0 in | Wt 270.0 lb

## 2016-10-20 DIAGNOSIS — I10 Essential (primary) hypertension: Secondary | ICD-10-CM

## 2016-10-20 DIAGNOSIS — R079 Chest pain, unspecified: Secondary | ICD-10-CM

## 2016-10-20 DIAGNOSIS — G4733 Obstructive sleep apnea (adult) (pediatric): Secondary | ICD-10-CM | POA: Diagnosis not present

## 2016-10-20 NOTE — Assessment & Plan Note (Signed)
History of essential hypertension blood pressure measured today 130/100. He is on spironolactone quinapril and amlodipine. Continue current meds at current dosing

## 2016-10-20 NOTE — Assessment & Plan Note (Signed)
History of atypical chest pain with fairly localized sharp pain that occurs on daily basis left second third time. He has had a heart catheterization performed at [facility] in [place] radially to 6 years ago that was apparently normal. I am going to get a exercise Myoview stress test to further evaluate.

## 2016-10-20 NOTE — Assessment & Plan Note (Signed)
History of obstructive sleep apnea currently not on CPAP. 

## 2016-10-20 NOTE — Progress Notes (Signed)
10/20/2016 Lenzy Kerschner   [birth year]  [id number]  Primary Physician Quay Burow, Claudina Lick, MD Primary Cardiologist: Lorretta Harp MD Renae Gloss  HPI:  Mr. Mazon is a very pleasant 51 year old moderately overweight married African-American male father of 3 children who works as a Medical illustrator. He is referred by Dr. Quay Burow for cardiovascular evaluation because of atypical chest pain. His risk factors include treated hypertension and diabetes. He's never had a heart attack or stroke. There is no family history. He probably did have a negative radial heart cath at [facility] in [place] 5-6 years ago. He stopped atypical left precordial sharp chest pain occurring daily lasting seconds at time.   Current Outpatient Prescriptions  Medication Sig Dispense Refill  . allopurinol (ZYLOPRIM) 100 MG tablet TAKE 2 TABLETS BY MOUTH EVERY DAY 60 tablet 5  . amLODipine (NORVASC) 5 MG tablet Take 1 tablet (5 mg total) by mouth daily. 90 tablet 3  . quinapril (ACCUPRIL) 20 MG tablet Take 1 tablet (20 mg total) by mouth 2 (two) times daily. 180 tablet 3  . Saxagliptin-Metformin (KOMBIGLYZE XR) 09-998 MG TB24 Take 1 tablet every day with meal 90 tablet 1  . spironolactone (ALDACTONE) 25 MG tablet Take 1 tablet (25 mg total) by mouth daily. 90 tablet 3  . TURMERIC CURCUMIN PO Take by mouth.    . Vitamin D, Ergocalciferol, (DRISDOL) 50000 units CAPS capsule Take 1 capsule (50,000 Units total) by mouth every 7 (seven) days. 12 capsule 0   No current facility-administered medications for this visit.     Allergies  Allergen Reactions  . Penicillins Swelling    Social History   Social History  . Marital status: Married    Spouse name: N/A  . Number of children: N/A  . Years of education: N/A   Occupational History  . Not on file.   Social History Main Topics  . Smoking status: Never Smoker  . Smokeless tobacco: Never Used  . Alcohol use Yes  . Drug use: No  .  Sexual activity: Not on file   Other Topics Concern  . Not on file   Social History Narrative  . No narrative on file     Review of Systems: General: negative for chills, fever, night sweats or weight changes.  Cardiovascular: negative for chest pain, dyspnea on exertion, edema, orthopnea, palpitations, paroxysmal nocturnal dyspnea or shortness of breath Dermatological: negative for rash Respiratory: negative for cough or wheezing Urologic: negative for hematuria Abdominal: negative for nausea, vomiting, diarrhea, bright red blood per rectum, melena, or hematemesis Neurologic: negative for visual changes, syncope, or dizziness All other systems reviewed and are otherwise negative except as noted above.    Blood pressure (!) 130/100, pulse 76, height 6' (1.829 m), weight 270 lb (122.5 kg).  General appearance: alert and no distress Neck: no adenopathy, no carotid bruit, no JVD, supple, symmetrical, trachea midline and thyroid not enlarged, symmetric, no tenderness/mass/nodules Lungs: clear to auscultation bilaterally Heart: regular rate and rhythm, S1, S2 normal, no murmur, click, rub or gallop Extremities: extremities normal, atraumatic, no cyanosis or edema  EKG sinus rhythm at 76 with borderline LVH. I personally reviewed this EKG.  ASSESSMENT AND PLAN:   Essential hypertension, benign History of essential hypertension blood pressure measured today 130/100. He is on spironolactone quinapril and amlodipine. Continue current meds at current dosing  OSA (obstructive sleep apnea) History of obstructive sleep apnea currently not on CPAP  Chest pain History of atypical  chest pain with fairly localized sharp pain that occurs on daily basis left second third time. He has had a heart catheterization performed at [facility] in [place] radially to 6 years ago that was apparently normal. I am going to get a exercise Myoview stress test to further  evaluate.      Lorretta Harp MD FACP,FACC,FAHA, [facility] 10/20/2016 4:04 PM

## 2016-10-20 NOTE — Patient Instructions (Signed)
Medication Instructions: Your physician recommends that you continue on your current medications as directed. Please refer to the Current Medication list given to you today.   Testing/Procedures: Your physician has requested that you have an exercise stress myoview. For further information please visit [email or web address]. Please follow instruction sheet, as given.  Follow-Up: Your physician recommends that you schedule a follow-up appointment as needed with Dr. Berry.   

## 2016-10-23 ENCOUNTER — Telehealth (HOSPITAL_COMMUNITY): Payer: Self-pay

## 2016-10-23 NOTE — Telephone Encounter (Signed)
Encounter complete. 

## 2016-10-27 ENCOUNTER — Encounter: Payer: Self-pay | Admitting: Cardiovascular Disease

## 2016-10-28 ENCOUNTER — Ambulatory Visit (HOSPITAL_COMMUNITY)
Admission: RE | Admit: 2016-10-28 | Discharge: 2016-10-28 | Disposition: A | Discharge status: Home / Self Care | Payer: BLUE CROSS/BLUE SHIELD | Source: Ambulatory Visit | Attending: Cardiovascular Disease | Admitting: Cardiovascular Disease

## 2016-10-28 DIAGNOSIS — Z8249 Family history of ischemic heart disease and other diseases of the circulatory system: Secondary | ICD-10-CM | POA: Insufficient documentation

## 2016-10-28 DIAGNOSIS — R079 Chest pain, unspecified: Secondary | ICD-10-CM | POA: Insufficient documentation

## 2016-10-28 LAB — MYOCARDIAL PERFUSION IMAGING
CHL CUP NUCLEAR SDS: 4
CHL CUP STRESS STAGE 1 DBP: 101 mmHg
CHL CUP STRESS STAGE 1 SBP: 140 mmHg
CHL CUP STRESS STAGE 1 SPEED: 0 mph
CHL CUP STRESS STAGE 2 HR: 96 {beats}/min
CHL CUP STRESS STAGE 3 HR: 97 {beats}/min
CHL CUP STRESS STAGE 4 GRADE: 10 %
CHL CUP STRESS STAGE 4 HR: 111 {beats}/min
CHL CUP STRESS STAGE 5 DBP: 108 mmHg
CHL CUP STRESS STAGE 5 GRADE: 12 %
CHL CUP STRESS STAGE 5 SBP: 184 mmHg
CHL CUP STRESS STAGE 6 HR: 157 {beats}/min
CHL CUP STRESS STAGE 8 GRADE: 0 %
CHL CUP STRESS STAGE 8 SPEED: 0 mph
CSEPED: 8 min
CSEPPBP: 220 mmHg
Estimated workload: 10.1 METS
Exercise duration (sec): 35 s
LV sys vol: 49 mL
LVDIAVOL: 104 mL (ref 62–150)
MPHR: 171 {beats}/min
NUC STRESS TID: 1.02
Peak HR: 157 {beats}/min
Percent HR: 91 %
Percent of predicted max HR: 91 %
RPE: 18
Rest HR: 74 {beats}/min
SRS: 3
SSS: 5
Stage 1 Grade: 0 %
Stage 1 HR: 91 {beats}/min
Stage 2 Grade: 0 %
Stage 2 Speed: 1 mph
Stage 3 Grade: 0 %
Stage 3 Speed: 1 mph
Stage 4 DBP: 104 mmHg
Stage 4 SBP: 162 mmHg
Stage 4 Speed: 1.7 mph
Stage 5 HR: 133 {beats}/min
Stage 5 Speed: 2.5 mph
Stage 6 DBP: 111 mmHg
Stage 6 Grade: 14 %
Stage 6 SBP: 220 mmHg
Stage 6 Speed: 3.4 mph
Stage 7 DBP: 81 mmHg
Stage 7 Grade: 0 %
Stage 7 HR: 137 {beats}/min
Stage 7 SBP: 170 mmHg
Stage 7 Speed: 0 mph
Stage 8 DBP: 92 mmHg
Stage 8 HR: 103 {beats}/min
Stage 8 SBP: 164 mmHg

## 2016-10-28 MED ORDER — TECHNETIUM TC 99M TETROFOSMIN IV KIT
10.1000 | PACK | Freq: Once | INTRAVENOUS | Status: AC | PRN
  Administered 2016-10-28: 10.1 via INTRAVENOUS
  Filled 2016-10-28: qty 11

## 2016-10-28 MED ORDER — TECHNETIUM TC 99M TETROFOSMIN IV KIT
30.5000 | PACK | Freq: Once | INTRAVENOUS | Status: AC | PRN
  Administered 2016-10-28: 30.5 via INTRAVENOUS
  Filled 2016-10-28: qty 31

## 2016-10-30 ENCOUNTER — Encounter: Payer: Self-pay | Admitting: Cardiovascular Disease

## 2016-10-31 ENCOUNTER — Other Ambulatory Visit: Payer: Self-pay | Admitting: Family Medicine

## 2016-11-03 ENCOUNTER — Encounter: Payer: Self-pay | Admitting: Internal Medicine

## 2017-01-08 NOTE — Patient Instructions (Addendum)
  Test(s) ordered today. Your results will be released to MyChart (or called to you) after review, usually within 72hours after test completion. If any changes need to be made, you will be notified at that same time.   Medications reviewed and updated.  No changes recommended at this time.    Please followup in 3 months   

## 2017-01-08 NOTE — Progress Notes (Signed)
Subjective:    Patient ID: Benjamin Chen, male    DOB: 1965-12-17, 51 y.o.   MRN: [id number]  HPI The patient is here for follow up.  Hypertension: He is taking his medication daily. He is not compliant with a low sodium diet.  He denies chest pain, palpitations, edema, and regular headaches. He is not exercising regularly.  He does not monitor his blood pressure at home.    Diabetes: He is taking his medication daily as prescribed. He is not as compliant with a diabetic diet as he could be. He is not exercising regularly.  He is not up-to-date with an ophthalmology examination.   Gout:  He is taking allopurinol daily, but forgets the medication on occasion.  He denies any gout flares, but will have occasional symptoms of gout when he forgets his pill.    Hyperlipidemia:  His LDL was above goal and he has diabetes.  He is not on medication.  He has tried three different statins in the past and did not tolerate them.   Obesity: He eats out a lot.  He could be better with decreasing the sweets, but he does pretty good.  He eats too much and it is not as healthy as it should be.   Medications and allergies reviewed with patient and updated if appropriate.  Patient Active Problem List   Diagnosis Date Noted  . Chest pain 10/07/2016  . Erectile dysfunction 10/07/2016  . Gout 01/29/2016  . Arthralgia 01/24/2016  . Hyperlipidemia 01/24/2016  . Essential hypertension, benign 12/30/2015  . Diabetes ([place]) 12/30/2015  . OSA (obstructive sleep apnea) 12/30/2015  . Depression 12/30/2015  . Bilateral primary osteoarthritis of knee 12/30/2015  . Joint pain 12/30/2015    Current Outpatient Prescriptions on File Prior to Visit  Medication Sig Dispense Refill  . allopurinol (ZYLOPRIM) 100 MG tablet TAKE 2 TABLETS BY MOUTH EVERY DAY 60 tablet 5  . amLODipine (NORVASC) 5 MG tablet Take 1 tablet (5 mg total) by mouth daily. 90 tablet 3  . quinapril (ACCUPRIL) 20 MG tablet Take 1 tablet (20 mg  total) by mouth 2 (two) times daily. 180 tablet 3  . Saxagliptin-Metformin (KOMBIGLYZE XR) 09-998 MG TB24 Take 1 tablet every day with meal 90 tablet 1  . spironolactone (ALDACTONE) 25 MG tablet Take 1 tablet (25 mg total) by mouth daily. 90 tablet 3  . TURMERIC CURCUMIN PO Take by mouth.    . Vitamin D, Ergocalciferol, (DRISDOL) 50000 units CAPS capsule Take 1 capsule (50,000 Units total) by mouth every 7 (seven) days. 12 capsule 0   No current facility-administered medications on file prior to visit.     Past Medical History:  Diagnosis Date  . Diabetes mellitus without complication ([place])   . Hyperlipidemia   . Hypertension     Past Surgical History:  Procedure Laterality Date  . KNEE ARTHROSCOPY Right    right - torn meniscus  . KNEE ARTHROSCOPY Left    left - torn meniscus    Social History   Social History  . Marital status: Married    Spouse name: N/A  . Number of children: N/A  . Years of education: N/A   Social History Main Topics  . Smoking status: Never Smoker  . Smokeless tobacco: Never Used  . Alcohol use Yes  . Drug use: No  . Sexual activity: Not Asked   Other Topics Concern  . None   Social History Narrative  . None    Family  History  Problem Relation Age of Onset  . Hyperlipidemia Mother   . Hypertension Mother   . Mental illness Father   . Heart disease Maternal Aunt   . Diabetes Maternal Grandmother     Review of Systems  Constitutional: Negative for chills and fever.  Respiratory: Positive for shortness of breath (occ when he moves too fast). Negative for cough and wheezing.   Cardiovascular: Negative for chest pain, palpitations and leg swelling.  Neurological: Positive for light-headedness. Negative for headaches.       Objective:   Vitals:   01/12/17 1626  BP: 118/86  Pulse: 88  Resp: 16  Temp: 98.5 F (36.9 C)  SpO2: 98%   Wt Readings from Last 3 Encounters:  01/12/17 278 lb (126.1 kg)  10/28/16 270 lb (122.5 kg)    10/20/16 270 lb (122.5 kg)   Body mass index is 37.7 kg/m.   Physical Exam    Constitutional: Appears well-developed and well-nourished. No distress.  HENT:  Head: Normocephalic and atraumatic.  Neck: Neck supple. No tracheal deviation present. No thyromegaly present.  No cervical lymphadenopathy Cardiovascular: Normal rate, regular rhythm and normal heart sounds.   No murmur heard. No carotid bruit .  No edema Pulmonary/Chest: Effort normal and breath sounds normal. No respiratory distress. No has no wheezes. No rales.  Skin: Skin is warm and dry. Not diaphoretic.  Psychiatric: Normal mood and affect. Behavior is normal.      Assessment & Plan:    See Problem List for Assessment and Plan of chronic medical problems.

## 2017-01-12 ENCOUNTER — Other Ambulatory Visit (INDEPENDENT_AMBULATORY_CARE_PROVIDER_SITE_OTHER): Payer: BLUE CROSS/BLUE SHIELD

## 2017-01-12 ENCOUNTER — Encounter: Payer: Self-pay | Admitting: Internal Medicine

## 2017-01-12 ENCOUNTER — Ambulatory Visit (INDEPENDENT_AMBULATORY_CARE_PROVIDER_SITE_OTHER): Payer: BLUE CROSS/BLUE SHIELD | Admitting: Internal Medicine

## 2017-01-12 VITALS — BP 118/86 | HR 88 | Temp 98.5°F | Resp 16 | Ht 72.0 in | Wt 278.0 lb

## 2017-01-12 DIAGNOSIS — Z114 Encounter for screening for human immunodeficiency virus [HIV]: Secondary | ICD-10-CM

## 2017-01-12 DIAGNOSIS — E119 Type 2 diabetes mellitus without complications: Secondary | ICD-10-CM | POA: Diagnosis not present

## 2017-01-12 DIAGNOSIS — E7800 Pure hypercholesterolemia, unspecified: Secondary | ICD-10-CM

## 2017-01-12 DIAGNOSIS — I10 Essential (primary) hypertension: Secondary | ICD-10-CM | POA: Diagnosis not present

## 2017-01-12 DIAGNOSIS — M109 Gout, unspecified: Secondary | ICD-10-CM | POA: Diagnosis not present

## 2017-01-12 DIAGNOSIS — Z6837 Body mass index (BMI) 37.0-37.9, adult: Secondary | ICD-10-CM | POA: Diagnosis not present

## 2017-01-12 LAB — COMPREHENSIVE METABOLIC PANEL
ALT: 30 U/L (ref 0–53)
AST: 18 U/L (ref 0–37)
Albumin: 4.1 g/dL (ref 3.5–5.2)
Alkaline Phosphatase: 83 U/L (ref 39–117)
BUN: 13 mg/dL (ref 6–23)
CALCIUM: 9.9 mg/dL (ref 8.4–10.5)
CO2: 30 meq/L (ref 19–32)
CREATININE: 1.07 mg/dL (ref 0.40–1.50)
Chloride: 99 mEq/L (ref 96–112)
GFR: 94.18 mL/min (ref >60.00)
Glucose, Bld: 172 mg/dL — ABNORMAL HIGH (ref 70–99)
POTASSIUM: 4.3 meq/L (ref 3.5–5.1)
Sodium: 136 mEq/L (ref 135–145)
Total Bilirubin: 0.7 mg/dL (ref 0.2–1.2)
Total Protein: 6.7 g/dL (ref 6.0–8.3)

## 2017-01-12 LAB — LIPID PANEL
CHOL/HDL RATIO: 7
CHOLESTEROL: 199 mg/dL (ref 0–200)
HDL: 27.6 mg/dL — AB (ref >39.00)
NonHDL: 171.13
TRIGLYCERIDES: 317 mg/dL — AB (ref 0.0–149.0)
VLDL: 63.4 mg/dL — ABNORMAL HIGH (ref 0.0–40.0)

## 2017-01-12 LAB — LDL CHOLESTEROL, DIRECT: Direct LDL: 103 mg/dL

## 2017-01-12 LAB — HEMOGLOBIN A1C: Hgb A1c MFr Bld: 7 % — ABNORMAL HIGH (ref 4.6–6.5)

## 2017-01-12 NOTE — Assessment & Plan Note (Signed)
?   Statin intolerant Check lipid panel  Work on weight loss Regular exercise and healthy diet encouraged

## 2017-01-12 NOTE — Assessment & Plan Note (Signed)
BP well controlled Current regimen effective and well tolerated Continue current medications at current doses cmp  

## 2017-01-12 NOTE — Assessment & Plan Note (Signed)
Check a1c Low sugar / carb diet Stressed regular exercise, weight loss  

## 2017-01-12 NOTE — Assessment & Plan Note (Signed)
Stressed compliance with medication Continue current dose

## 2017-01-12 NOTE — Assessment & Plan Note (Signed)
Stressed weight loss Discussed regular exercise -he an walk - encourage walking 10 min a day to start  Eats out too much - start to look fir healthier options and try  To eat at home more Decreased portions

## 2017-01-13 LAB — HIV ANTIBODY (ROUTINE TESTING W REFLEX): HIV 1&2 Ab, 4th Generation: NONREACTIVE

## 2017-01-14 ENCOUNTER — Encounter: Payer: Self-pay | Admitting: Internal Medicine

## 2017-01-18 ENCOUNTER — Encounter: Payer: Self-pay | Admitting: Internal Medicine

## 2017-01-18 ENCOUNTER — Ambulatory Visit (INDEPENDENT_AMBULATORY_CARE_PROVIDER_SITE_OTHER): Payer: BLUE CROSS/BLUE SHIELD | Admitting: Internal Medicine

## 2017-01-18 DIAGNOSIS — G4733 Obstructive sleep apnea (adult) (pediatric): Secondary | ICD-10-CM | POA: Diagnosis not present

## 2017-01-18 NOTE — Patient Instructions (Signed)
Order- unattended home sleep Test     Dx OSA  Recommend you discuss with Dr Quay Burow your esophageal reflux, food hang-up in your throat, and choking episodes like stomach juice in your throat at night. She may want you to see a gastroenterologist. Meanwhile, don't lie down for at least an hour after eating, prop your head up a little while sleeping. I also suggest you start taking otc Pepcid 10 mg once daily before breakfast.  Please call me 2 weeks after your sleep test, for results and recommendations.

## 2017-01-18 NOTE — Progress Notes (Signed)
01/18/17-52 year old male never smoker for sleep evaluation. Referred courtesy of Dr Billey Gosling; had sleep study about 5-6 years ago in [place];has never been put on CPAP-was told he has mild sleep apnea Medical problem list includes DM 2, HBP, gout, obesity Epworth score 9/24 Wife tells him of witnessed apneas and loud snoring. He is not aware of leg movements or other parasomnias. He occasionally wakes choking and suspect reflux. Past history of treated heartburn. Some solid food hanging up mid esophagus. He drinks one or 2 cups of coffee in the morning only on work days. No ENT surgery. No lung disease. No heart disease. Epworth score 9/24  Prior to Admission medications   Medication Sig Start Date End Date Taking? Authorizing Provider  allopurinol (ZYLOPRIM) 100 MG tablet TAKE 2 TABLETS BY MOUTH EVERY DAY 10/06/16  Yes Hulan Saas M, DO  amLODipine (NORVASC) 5 MG tablet Take 1 tablet (5 mg total) by mouth daily. 10/07/16  Yes Burns, Claudina Lick, MD  quinapril (ACCUPRIL) 20 MG tablet Take 1 tablet (20 mg total) by mouth 2 (two) times daily. 10/07/16  Yes Binnie Rail, MD  Saxagliptin-Metformin (KOMBIGLYZE XR) 09-998 MG TB24 Take 1 tablet every day with meal 10/07/16  Yes Burns, Claudina Lick, MD  spironolactone (ALDACTONE) 25 MG tablet Take 1 tablet (25 mg total) by mouth daily. 10/07/16  Yes Burns, Claudina Lick, MD  TURMERIC CURCUMIN PO Take by mouth.   Yes [provider]  Vitamin D, Ergocalciferol, (DRISDOL) 50000 units CAPS capsule Take 1 capsule (50,000 Units total) by mouth every 7 (seven) days. 10/06/16  Yes Lyndal Pulley, DO   PLAN:,h Past Surgical History:  Procedure Laterality Date  . KNEE ARTHROSCOPY Right    right - torn meniscus  . KNEE ARTHROSCOPY Left    left - torn meniscus   Family History  Problem Relation Age of Onset  . Hyperlipidemia Mother   . Hypertension Mother   . Mental illness Father   . Heart disease Maternal Aunt   . Diabetes Maternal Grandmother    Social  History   Social History  . Marital status: Married    Spouse name: N/A  . Number of children: N/A  . Years of education: N/A   Occupational History  . Not on file.   Social History Main Topics  . Smoking status: Never Smoker  . Smokeless tobacco: Never Used  . Alcohol use Yes  . Drug use: No  . Sexual activity: Not on file   Other Topics Concern  . Not on file   Social History Narrative  . No narrative on file   ROS-see HPI   Negative unless "+" Constitutional:    weight loss, night sweats, fevers, chills, fatigue, lassitude. HEENT:    headaches, + difficulty swallowing, tooth/dental problems, sore throat,       sneezing, itching, ear ache, nasal congestion, post nasal drip, snoring CV:    chest pain, orthopnea, PND, swelling in lower extremities, anasarca,                                                       dizziness, palpitations Resp:   shortness of breath with exertion or at rest.                productive cough,   non-productive cough, coughing up of  blood.              change in color of mucus.  wheezing.   Skin:    rash or lesions. GI:  No-   heartburn, indigestion, abdominal pain, nausea, vomiting, diarrhea,                 change in bowel habits, loss of appetite GU: dysuria, change in color of urine, no urgency or frequency.   flank pain. MS:   joint pain, stiffness, decreased range of motion, back pain. Neuro-     nothing unusual Psych:  change in mood or affect.  depression or anxiety.   memory loss.  OBJ- Physical Exam General- Alert, Oriented, Affect-appropriate, Distress- none acute, + morbidly obese Skin- rash-none, lesions- none, excoriation- none Lymphadenopathy- none Head- atraumatic            Eyes- Gross vision intact, PERRLA, conjunctivae and secretions clear            Ears- Hearing, canals-normal            Nose- Clear, no-Septal dev, mucus, polyps, erosion, perforation             Throat- Mallampati III , mucosa clear , drainage- none,  tonsils- atrophic Neck- flexible , trachea midline, no stridor , thyroid nl, carotid no bruit Chest - symmetrical excursion , unlabored           Heart/CV- RRR , no murmur , no gallop  , no rub, nl s1 s2                           - JVD- none , edema- none, stasis changes- none, varices- none           Lung- clear to P&A, wheeze- none, cough- none , dullness-none, rub- none           Chest wall-  Abd-  Br/ Gen/ Rectal- Not done, not indicated Extrem- cyanosis- none, clubbing, none, atrophy- none, strength- nl Neuro- grossly intact to observation

## 2017-01-24 NOTE — Assessment & Plan Note (Signed)
We discussed the interaction between obesity and obstructive sleep apnea emphasizing how important it is for him to begin working towards normal body weight.

## 2017-01-24 NOTE — Assessment & Plan Note (Signed)
Symptoms and physical exam consistent with medically significant obstructive sleep apnea. We reviewed the physiology, medical concerns and most common treatments. Plan-schedule sleep study and call us for recommendation 2 weeks later.

## 2017-02-07 DIAGNOSIS — G4733 Obstructive sleep apnea (adult) (pediatric): Secondary | ICD-10-CM | POA: Diagnosis not present

## 2017-02-08 DIAGNOSIS — G4733 Obstructive sleep apnea (adult) (pediatric): Secondary | ICD-10-CM

## 2017-03-09 ENCOUNTER — Other Ambulatory Visit: Payer: Self-pay | Admitting: *Deleted

## 2017-03-09 DIAGNOSIS — G4733 Obstructive sleep apnea (adult) (pediatric): Secondary | ICD-10-CM

## 2017-03-22 ENCOUNTER — Ambulatory Visit (INDEPENDENT_AMBULATORY_CARE_PROVIDER_SITE_OTHER): Payer: BLUE CROSS/BLUE SHIELD | Admitting: Adult Health

## 2017-03-22 ENCOUNTER — Encounter: Payer: Self-pay | Admitting: Adult Health

## 2017-03-22 VITALS — BP 128/80 | HR 90 | Ht 72.0 in | Wt 282.4 lb

## 2017-03-22 DIAGNOSIS — G4733 Obstructive sleep apnea (adult) (pediatric): Secondary | ICD-10-CM | POA: Diagnosis not present

## 2017-03-22 NOTE — Assessment & Plan Note (Signed)
-   Weight loss 

## 2017-03-22 NOTE — Assessment & Plan Note (Signed)
Mild sleep apnea recommend to begin CPAP.  Patient has underlying hypertension and diabetes.  Plan Patient Instructions  Begin CPAP At bedtime  .  Wear for at least 4hr each night .  Work on weight loss.  Do not drive if sleepy .  follow up Dr. Annamaria Boots  In 6-8 weeks and As needed

## 2017-03-22 NOTE — Progress Notes (Signed)
@Patient  ID: Benjamin Chen, male    DOB: April 13, 1966, 51 y.o.   MRN: [id number]  No chief complaint on file.   Referring provider: Binnie Rail, MD  HPI: 51 yo male seen for sleep consult 01/2017 for daytime sleepiness, snoring and restless sleep  Found to have mild OSA  Has HTN  And DM  TEST  HST 01/2017 >AHI 12.8/hr and SaO2 low 83%.   03/22/2017 Follow up : OSA  Patient returns for a follow-up for sleep apnea.  Patient was seen for a sleep consult September 2018 for daytime sleepiness snoring and restless sleep.  He was set up for a home sleep study that showed mild sleep apnea with AHI 12.8.  SaO2 low of 83%.  We discussed his sleep study results.  Went over treatment options including weight loss oral appliance and CPAP.  Patient has underlying hypertension and diabetes.  He would like to proceed with CPAP.  Patient education was given for sleep apnea and CPAP.    Allergies  Allergen Reactions  . Penicillins Swelling    Immunization History  Administered Date(s) Administered  . Influenza-Unspecified 01/16/2017    Past Medical History:  Diagnosis Date  . Diabetes mellitus without complication ([place])   . Hyperlipidemia   . Hypertension     Tobacco History: Social History   Tobacco Use  Smoking Status Never Smoker  Smokeless Tobacco Never Used   Counseling given: Not Answered   Outpatient Encounter Medications as of 03/22/2017  Medication Sig  . allopurinol (ZYLOPRIM) 100 MG tablet TAKE 2 TABLETS BY MOUTH EVERY DAY  . amLODipine (NORVASC) 5 MG tablet Take 1 tablet (5 mg total) by mouth daily.  . quinapril (ACCUPRIL) 20 MG tablet Take 1 tablet (20 mg total) by mouth 2 (two) times daily.  . Saxagliptin-Metformin (KOMBIGLYZE XR) 09-998 MG TB24 Take 1 tablet every day with meal  . spironolactone (ALDACTONE) 25 MG tablet Take 1 tablet (25 mg total) by mouth daily.  . TURMERIC CURCUMIN PO Take by mouth.  . Vitamin D, Ergocalciferol, (DRISDOL) 50000 units CAPS  capsule Take 1 capsule (50,000 Units total) by mouth every 7 (seven) days.   No facility-administered encounter medications on file as of 03/22/2017.      Review of Systems  Constitutional:   No  weight loss, night sweats,  Fevers, chills, fatigue, or  lassitude.  HEENT:   No headaches,  Difficulty swallowing,  Tooth/dental problems, or  Sore throat,                No sneezing, itching, ear ache, nasal congestion, post nasal drip,   CV:  No chest pain,  Orthopnea, PND, swelling in lower extremities, anasarca, dizziness, palpitations, syncope.   GI  No heartburn, indigestion, abdominal pain, nausea, vomiting, diarrhea, change in bowel habits, loss of appetite, bloody stools.   Resp: No shortness of breath with exertion or at rest.  No excess mucus, no productive cough,  No non-productive cough,  No coughing up of blood.  No change in color of mucus.  No wheezing.  No chest wall deformity  Skin: no rash or lesions.  GU: no dysuria, change in color of urine, no urgency or frequency.  No flank pain, no hematuria   MS:  No joint pain or swelling.  No decreased range of motion.  No back pain.    Physical Exam  BP 128/80 (BP Location: Left Arm, Cuff Size: Normal)   Pulse 90   Ht 6' (1.829 m)  Wt 282 lb 6.4 oz (128.1 kg)   SpO2 99%   BMI 38.30 kg/m   GEN: A/Ox3; pleasant , NAD, obese   HEENT:  [place]/AT,  EACs-clear, TMs-wnl, NOSE-clear, THROAT-clear, no lesions, no postnasal drip or exudate noted.  Class II-III NP airway  NECK:  Supple w/ fair ROM; no JVD; normal carotid impulses w/o bruits; no thyromegaly or nodules palpated; no lymphadenopathy.    RESP  Clear  P & A; w/o, wheezes/ rales/ or rhonchi. no accessory muscle use, no dullness to percussion  CARD:  RRR, no m/r/g, no peripheral edema, pulses intact, no cyanosis or clubbing.  GI:   Soft & nt; nml bowel sounds; no organomegaly or masses detected.   Musco: Warm bil, no deformities or joint swelling noted.   Neuro:  alert, no focal deficits noted.    Skin: Warm, no lesions or rashes    Lab Results:  BNP No results found for: BNP  ProBNP No results found for: PROBNP  Imaging: No results found.   Assessment & Plan:   OSA (obstructive sleep apnea) Mild sleep apnea recommend to begin CPAP.  Patient has underlying hypertension and diabetes.  Plan Patient Instructions  Begin CPAP At bedtime  .  Wear for at least 4hr each night .  Work on weight loss.  Do not drive if sleepy .  follow up Dr. Annamaria Boots  In 6-8 weeks and As needed       Obesity, morbid ([place]) Weight loss     Rexene Edison, NP 03/22/2017

## 2017-03-22 NOTE — Patient Instructions (Signed)
Begin CPAP At bedtime  .  Wear for at least 4hr each night .  Work on weight loss.  Do not drive if sleepy .  follow up Dr. Annamaria Boots  In 6-8 weeks and As needed

## 2017-04-13 ENCOUNTER — Encounter: Payer: Self-pay | Admitting: Internal Medicine

## 2017-04-13 ENCOUNTER — Ambulatory Visit: Payer: BLUE CROSS/BLUE SHIELD | Admitting: Internal Medicine

## 2017-04-13 ENCOUNTER — Other Ambulatory Visit (INDEPENDENT_AMBULATORY_CARE_PROVIDER_SITE_OTHER): Payer: BLUE CROSS/BLUE SHIELD

## 2017-04-13 VITALS — BP 126/86 | HR 92 | Temp 98.2°F | Resp 16 | Wt 283.0 lb

## 2017-04-13 DIAGNOSIS — E119 Type 2 diabetes mellitus without complications: Secondary | ICD-10-CM

## 2017-04-13 DIAGNOSIS — E7849 Other hyperlipidemia: Secondary | ICD-10-CM

## 2017-04-13 DIAGNOSIS — K6289 Other specified diseases of anus and rectum: Secondary | ICD-10-CM

## 2017-04-13 DIAGNOSIS — M109 Gout, unspecified: Secondary | ICD-10-CM | POA: Diagnosis not present

## 2017-04-13 DIAGNOSIS — M170 Bilateral primary osteoarthritis of knee: Secondary | ICD-10-CM | POA: Diagnosis not present

## 2017-04-13 DIAGNOSIS — I10 Essential (primary) hypertension: Secondary | ICD-10-CM

## 2017-04-13 DIAGNOSIS — Z1211 Encounter for screening for malignant neoplasm of colon: Secondary | ICD-10-CM

## 2017-04-13 LAB — COMPREHENSIVE METABOLIC PANEL
ALK PHOS: 74 U/L (ref 39–117)
ALT: 20 U/L (ref 0–53)
AST: 14 U/L (ref 0–37)
Albumin: 4.3 g/dL (ref 3.5–5.2)
BUN: 15 mg/dL (ref 6–23)
CHLORIDE: 101 meq/L (ref 96–112)
CO2: 33 mEq/L — ABNORMAL HIGH (ref 19–32)
Calcium: 9.7 mg/dL (ref 8.4–10.5)
Creatinine, Ser: 1.16 mg/dL (ref 0.40–1.50)
GFR: 85.71 mL/min (ref >60.00)
GLUCOSE: 156 mg/dL — AB (ref 70–99)
POTASSIUM: 4.4 meq/L (ref 3.5–5.1)
SODIUM: 139 meq/L (ref 135–145)
TOTAL PROTEIN: 6.9 g/dL (ref 6.0–8.3)
Total Bilirubin: 0.6 mg/dL (ref 0.2–1.2)

## 2017-04-13 LAB — HEMOGLOBIN A1C: Hgb A1c MFr Bld: 7.1 % — ABNORMAL HIGH (ref 4.6–6.5)

## 2017-04-13 NOTE — Patient Instructions (Addendum)
  Test(s) ordered today. Your results will be released to MyChart (or called to you) after review, usually within 72hours after test completion. If any changes need to be made, you will be notified at that same time.  No immunizations administered today.   Medications reviewed and updated.  No changes recommended at this time.  Please followup in 6 months   

## 2017-04-13 NOTE — Assessment & Plan Note (Signed)
BP well controlled Current regimen effective and well tolerated Continue current medications at current doses cmp  

## 2017-04-13 NOTE — Progress Notes (Signed)
Subjective:    Patient ID: Benjamin Chen, male    DOB: [birth year], 51 y.o.   MRN: [id number]  HPI The patient is here for follow up.  Hypertension: He is taking his medication daily. He is compliant with a low sodium diet.  He denies chest pain, frequent palpitations, edema, frequent shortness of breath and regular headaches. He is not exercising regularly.     Diabetes: He is taking his medication daily as prescribed. He is somewhat compliant with a diabetic diet. He is not exercising regularly. He checks his feet daily and denies foot lesions. He is up-to-date with an ophthalmology examination.   Gout:  He is taking the allopurinol daily.  He denies gout symptoms.  He sometimes has toe pain but is unsure if it is the gout.   Hyperlipidemia: He is statin intolerant. He is compliant with a low fat/cholesterol diet. He is not exercising regularly.    He has anal pain and would like a referral to GI.  He denies rectal bleeding.  He will be 51 this week.   Medications and allergies reviewed with patient and updated if appropriate.  Patient Active Problem List   Diagnosis Date Noted  . Obesity, morbid ([place]) 01/12/2017  . Chest pain 10/07/2016  . Erectile dysfunction 10/07/2016  . Gout 01/29/2016  . Arthralgia 01/24/2016  . Hyperlipidemia 01/24/2016  . Essential hypertension, benign 12/30/2015  . Diabetes ([place]) 12/30/2015  . OSA (obstructive sleep apnea) 12/30/2015  . Depression 12/30/2015  . Bilateral primary osteoarthritis of knee 12/30/2015  . Joint pain 12/30/2015    Current Outpatient Medications on File Prior to Visit  Medication Sig Dispense Refill  . allopurinol (ZYLOPRIM) 100 MG tablet TAKE 2 TABLETS BY MOUTH EVERY DAY 60 tablet 5  . amLODipine (NORVASC) 5 MG tablet Take 1 tablet (5 mg total) by mouth daily. 90 tablet 3  . quinapril (ACCUPRIL) 20 MG tablet Take 1 tablet (20 mg total) by mouth 2 (two) times daily. 180 tablet 3  . Saxagliptin-Metformin (KOMBIGLYZE XR)  09-998 MG TB24 Take 1 tablet every day with meal 90 tablet 1  . spironolactone (ALDACTONE) 25 MG tablet Take 1 tablet (25 mg total) by mouth daily. 90 tablet 3  . TURMERIC CURCUMIN PO Take by mouth.    . Vitamin D, Ergocalciferol, (DRISDOL) 50000 units CAPS capsule Take 1 capsule (50,000 Units total) by mouth every 7 (seven) days. 12 capsule 0   No current facility-administered medications on file prior to visit.     Past Medical History:  Diagnosis Date  . Diabetes mellitus without complication ([place])   . Hyperlipidemia   . Hypertension     Past Surgical History:  Procedure Laterality Date  . KNEE ARTHROSCOPY Right    right - torn meniscus  . KNEE ARTHROSCOPY Left    left - torn meniscus    Social History   Socioeconomic History  . Marital status: Married    Spouse name: None  . Number of children: None  . Years of education: None  . Highest education level: None  Social Needs  . Financial resource strain: None  . Food insecurity - worry: None  . Food insecurity - inability: None  . Transportation needs - medical: None  . Transportation needs - non-medical: None  Occupational History  . None  Tobacco Use  . Smoking status: Never Smoker  . Smokeless tobacco: Never Used  Substance and Sexual Activity  . Alcohol use: Yes  . Drug use: No  .  Sexual activity: None  Other Topics Concern  . None  Social History Narrative  . None    Family History  Problem Relation Age of Onset  . Hyperlipidemia Mother   . Hypertension Mother   . Mental illness Father   . Heart disease Maternal Aunt   . Diabetes Maternal Grandmother     Review of Systems  Constitutional: Negative for chills and fever.  Respiratory: Positive for shortness of breath (sometimes). Negative for cough and wheezing.   Cardiovascular: Positive for palpitations (occ). Negative for chest pain and leg swelling.  Gastrointestinal:       Anal pain  Musculoskeletal: Negative for arthralgias.    Neurological: Negative for light-headedness and headaches.       Objective:   Vitals:   04/13/17 1627  BP: 126/86  Pulse: 92  Resp: 16  Temp: 98.2 F (36.8 C)  SpO2: 98%   Wt Readings from Last 3 Encounters:  04/13/17 283 lb (128.4 kg)  03/22/17 282 lb 6.4 oz (128.1 kg)  01/18/17 272 lb 9.6 oz (123.7 kg)   Body mass index is 38.38 kg/m.   Physical Exam    Constitutional: Appears well-developed and well-nourished. No distress.  HENT:  Head: Normocephalic and atraumatic.  Neck: Neck supple. No tracheal deviation present. No thyromegaly present.  No cervical lymphadenopathy Cardiovascular: Normal rate, regular rhythm and normal heart sounds.   No murmur heard. No carotid bruit .  No edema Pulmonary/Chest: Effort normal and breath sounds normal. No respiratory distress. No has no wheezes. No rales.  Skin: Skin is warm and dry. Not diaphoretic.  Psychiatric: Normal mood and affect. Behavior is normal.      Assessment & Plan:    See Problem List for Assessment and Plan of chronic medical problems.

## 2017-04-13 NOTE — Assessment & Plan Note (Signed)
?   Controlled Discussed that we can increase allopurinol to 300 mg daily if needed Will continue 200 mg daily for now

## 2017-04-13 NOTE — Assessment & Plan Note (Addendum)
Dr Berenice Primas - [facility] Severe OA b/l Has had cortisone injections Will try gel injections Will need b/l knee replacements eventually

## 2017-04-13 NOTE — Assessment & Plan Note (Signed)
Check a1c Low sugar / carb diet Stressed regular exercise, weight loss  

## 2017-04-13 NOTE — Assessment & Plan Note (Signed)
Refer to GI Also in need of colorectal cancer screening

## 2017-04-13 NOTE — Assessment & Plan Note (Signed)
Hold off on checking lipids since he is not fasting Stressed exercise, weight loss and healthy diet

## 2017-04-15 ENCOUNTER — Encounter: Payer: Self-pay | Admitting: Internal Medicine

## 2017-04-22 ENCOUNTER — Ambulatory Visit: Payer: BLUE CROSS/BLUE SHIELD | Admitting: Internal Medicine

## 2017-04-24 ENCOUNTER — Other Ambulatory Visit: Payer: Self-pay | Admitting: Family Medicine

## 2017-05-19 ENCOUNTER — Telehealth: Payer: Self-pay | Admitting: Internal Medicine

## 2017-05-19 NOTE — Telephone Encounter (Signed)
Spoke with patient. He stated that he has not received his CPAP machine. Per his chart, it was ordered back in November through [facility]. [facility] has not reached out to him at all. Advised him that I would call [facility] in the morning since they are currently closed. He verbalized understanding.   Also advised him that he did not need to move up his appt since he does not have his CPAP machine. He verbalized understanding.

## 2017-05-21 ENCOUNTER — Ambulatory Visit: Payer: BLUE CROSS/BLUE SHIELD | Admitting: Pulmonary Disease

## 2017-05-24 ENCOUNTER — Other Ambulatory Visit: Payer: Self-pay | Admitting: Internal Medicine

## 2017-06-14 ENCOUNTER — Other Ambulatory Visit: Payer: Self-pay | Admitting: Family Medicine

## 2017-06-14 ENCOUNTER — Encounter: Payer: Self-pay | Admitting: Internal Medicine

## 2017-06-17 ENCOUNTER — Encounter: Payer: Self-pay | Admitting: Internal Medicine

## 2017-07-04 ENCOUNTER — Other Ambulatory Visit: Payer: Self-pay | Admitting: Family Medicine

## 2017-07-20 ENCOUNTER — Ambulatory Visit: Payer: BLUE CROSS/BLUE SHIELD | Admitting: Internal Medicine

## 2017-08-02 ENCOUNTER — Other Ambulatory Visit: Payer: Self-pay | Admitting: Internal Medicine

## 2017-09-25 ENCOUNTER — Other Ambulatory Visit: Payer: Self-pay | Admitting: Family Medicine

## 2017-10-09 ENCOUNTER — Other Ambulatory Visit: Payer: Self-pay | Admitting: Internal Medicine

## 2017-10-13 ENCOUNTER — Encounter: Payer: Self-pay | Admitting: Internal Medicine

## 2017-10-13 ENCOUNTER — Other Ambulatory Visit (INDEPENDENT_AMBULATORY_CARE_PROVIDER_SITE_OTHER): Payer: BLUE CROSS/BLUE SHIELD

## 2017-10-13 ENCOUNTER — Ambulatory Visit: Payer: BLUE CROSS/BLUE SHIELD | Admitting: Internal Medicine

## 2017-10-13 VITALS — BP 130/86 | HR 91 | Temp 98.0°F | Resp 16 | Ht 72.0 in | Wt 294.0 lb

## 2017-10-13 DIAGNOSIS — I10 Essential (primary) hypertension: Secondary | ICD-10-CM | POA: Diagnosis not present

## 2017-10-13 DIAGNOSIS — M109 Gout, unspecified: Secondary | ICD-10-CM

## 2017-10-13 DIAGNOSIS — Z125 Encounter for screening for malignant neoplasm of prostate: Secondary | ICD-10-CM

## 2017-10-13 DIAGNOSIS — E119 Type 2 diabetes mellitus without complications: Secondary | ICD-10-CM

## 2017-10-13 DIAGNOSIS — Z1211 Encounter for screening for malignant neoplasm of colon: Secondary | ICD-10-CM

## 2017-10-13 DIAGNOSIS — E7849 Other hyperlipidemia: Secondary | ICD-10-CM

## 2017-10-13 DIAGNOSIS — R5383 Other fatigue: Secondary | ICD-10-CM | POA: Insufficient documentation

## 2017-10-13 DIAGNOSIS — N529 Male erectile dysfunction, unspecified: Secondary | ICD-10-CM

## 2017-10-13 DIAGNOSIS — Z23 Encounter for immunization: Secondary | ICD-10-CM

## 2017-10-13 LAB — COMPREHENSIVE METABOLIC PANEL
ALBUMIN: 4.2 g/dL (ref 3.5–5.2)
ALT: 43 U/L (ref 0–53)
AST: 23 U/L (ref 0–37)
Alkaline Phosphatase: 90 U/L (ref 39–117)
BILIRUBIN TOTAL: 0.6 mg/dL (ref 0.2–1.2)
BUN: 17 mg/dL (ref 6–23)
CO2: 28 mEq/L (ref 19–32)
CREATININE: 1.14 mg/dL (ref 0.40–1.50)
Calcium: 9.9 mg/dL (ref 8.4–10.5)
Chloride: 100 mEq/L (ref 96–112)
GFR: 87.27 mL/min (ref >60.00)
Glucose, Bld: 166 mg/dL — ABNORMAL HIGH (ref 70–99)
Potassium: 4.2 mEq/L (ref 3.5–5.1)
Sodium: 136 mEq/L (ref 135–145)
Total Protein: 7.1 g/dL (ref 6.0–8.3)

## 2017-10-13 LAB — LIPID PANEL
CHOL/HDL RATIO: 8
CHOLESTEROL: 205 mg/dL — AB (ref 0–200)
HDL: 25.1 mg/dL — ABNORMAL LOW (ref >39.00)
NonHDL: 179.93
TRIGLYCERIDES: 375 mg/dL — AB (ref 0.0–149.0)
VLDL: 75 mg/dL — AB (ref 0.0–40.0)

## 2017-10-13 LAB — URIC ACID: URIC ACID, SERUM: 5.6 mg/dL (ref 4.0–7.8)

## 2017-10-13 LAB — HEMOGLOBIN A1C: HEMOGLOBIN A1C: 8.4 % — AB (ref 4.6–6.5)

## 2017-10-13 LAB — LDL CHOLESTEROL, DIRECT: Direct LDL: 120 mg/dL

## 2017-10-13 LAB — TSH: TSH: 1.72 u[IU]/mL (ref 0.35–4.50)

## 2017-10-13 NOTE — Assessment & Plan Note (Signed)
Taking allopurinol 100 mg daily Occasional occasional gout symptoms Check uric acid level Stressed better control of his sugars and decreasing his weight, which may help get rid of the gout

## 2017-10-13 NOTE — Assessment & Plan Note (Signed)
Blood pressure controlled Should ideally be lower We will continue current medications Had a long talk with him discussing the importance of weight loss and lifestyle changes Start regular exercise Decrease sodium intake-needs to stop eating out as much CMP

## 2017-10-13 NOTE — Assessment & Plan Note (Signed)
Experiencing erectile dysfunction We will check testosterone level, PSA Is interested in seeing neurology-referred Stressed regular exercise and weight loss

## 2017-10-13 NOTE — Assessment & Plan Note (Signed)
Complains of fatigue and decreased motivation Denies depression and does not want to consider medication for depression Will check labs to rule out some causes-TSH Will check testosterone level Stressed weight loss Stressed regular exercise Using CPAP

## 2017-10-13 NOTE — Assessment & Plan Note (Signed)
Has not tolerated statins in the past Stressed regular exercise and weight loss Stressed improving diet

## 2017-10-13 NOTE — Patient Instructions (Addendum)
  Test(s) ordered today. Your results will be released to [place] (or called to you) after review, usually within 72hours after test completion. If any changes need to be made, you will be notified at that same time.   Tetanus immunizations administered today.   Medications reviewed and updated.  No changes recommended at this time.    A referral was ordered for GI and urology.   Please followup in 6 months

## 2017-10-13 NOTE — Assessment & Plan Note (Signed)
A1c Stressed the importance of regular exercise and weight loss Not always compliant with a diabetic diet Discussed with him that lifestyle can prevent further medication and possibly get him off his current medication I appointment scheduled No change in medication-we will adjust if needed based on A1c

## 2017-10-13 NOTE — Progress Notes (Signed)
Subjective:    Patient ID: Benjamin Chen, male    DOB: 02-09-1966, 52 y.o.   MRN: [id number]  HPI The patient is here for follow up.  Hypertension: He is taking his medication daily. He is not compliant with a low sodium diet.  He denies chest pain, palpitations, shortness of breath and regular headaches. He is not exercising regularly.  He does not monitor his blood pressure at home.    Diabetes: He is taking his medication daily as prescribed. He is not compliant with a diabetic diet. He is not exercising regularly.  He checks his feet daily and denies foot lesions. He is not up-to-date with an ophthalmology examination, but he has an appointment scheduled.   Hyperlipidemia: He is not on any medication. He has tried three different statins and did not tolerate them. He is not compliant with a low fat/cholesterol diet. He is not exercising regularly.  Erectile dysfunction: He continues to experience some degree of erectile dysfunction.  He also wonders again about his testosterone level and if that is low, which could be causing several of his symptoms.  I had referred him to urologist in the past, but he never went.  He is interested in another referral   Medications and allergies reviewed with patient and updated if appropriate.  Patient Active Problem List   Diagnosis Date Noted  . Anal pain 04/13/2017  . Obesity, morbid ([place]) 01/12/2017  . Chest pain 10/07/2016  . Erectile dysfunction 10/07/2016  . Gout 01/29/2016  . Arthralgia 01/24/2016  . Hyperlipidemia 01/24/2016  . Essential hypertension, benign 12/30/2015  . Diabetes ([place]) 12/30/2015  . OSA (obstructive sleep apnea) 12/30/2015  . Depression 12/30/2015  . Bilateral primary osteoarthritis of knee 12/30/2015  . Joint pain 12/30/2015    Current Outpatient Medications on File Prior to Visit  Medication Sig Dispense Refill  . allopurinol (ZYLOPRIM) 100 MG tablet TAKE 2 TABLETS BY MOUTH EVERY DAY 60 tablet 5  .  amLODipine (NORVASC) 5 MG tablet TAKE 1 TABLET BY MOUTH EVERY DAY 90 tablet 0  . meloxicam (MOBIC) 15 MG tablet TAKE 1 TABLET DAILY AS NEEDED WITH FOOD FOR PAIN AND INFLAMATION  1  . quinapril (ACCUPRIL) 20 MG tablet Take 1 tablet (20 mg total) by mouth 2 (two) times daily. 180 tablet 3  . Saxagliptin-Metformin (KOMBIGLYZE XR) 09-998 MG TB24 TAKE 1 TABLET EVERY DAY WITH MEAL 90 tablet 1  . spironolactone (ALDACTONE) 25 MG tablet TAKE 1 TABLET BY MOUTH EVERY DAY 90 tablet 0  . tiZANidine (ZANAFLEX) 2 MG tablet TAKE 1 TABLET BY MOUTH EVERY 12 HOURS AS NEEDED PAIN/SPASM  0  . TURMERIC CURCUMIN PO Take by mouth.    . Vitamin D, Ergocalciferol, (DRISDOL) 50000 units CAPS capsule TAKE 1 CAPSULE BY MOUTH EVERY 7 (SEVEN) DAYS. 12 capsule 0   No current facility-administered medications on file prior to visit.     Past Medical History:  Diagnosis Date  . Diabetes mellitus without complication ([place])   . Hyperlipidemia   . Hypertension     Past Surgical History:  Procedure Laterality Date  . KNEE ARTHROSCOPY Right    right - torn meniscus  . KNEE ARTHROSCOPY Left    left - torn meniscus    Social History   Socioeconomic History  . Marital status: Married    Spouse name: Not on file  . Number of children: Not on file  . Years of education: Not on file  . Highest education level: Not on  file  Occupational History  . Not on file  Social Needs  . Financial resource strain: Not on file  . Food insecurity:    Worry: Not on file    Inability: Not on file  . Transportation needs:    Medical: Not on file    Non-medical: Not on file  Tobacco Use  . Smoking status: Never Smoker  . Smokeless tobacco: Never Used  Substance and Sexual Activity  . Alcohol use: Yes  . Drug use: No  . Sexual activity: Not on file  Lifestyle  . Physical activity:    Days per week: Not on file    Minutes per session: Not on file  . Stress: Not on file  Relationships  . Social connections:    Talks on  phone: Not on file    Gets together: Not on file    Attends religious service: Not on file    Active member of club or organization: Not on file    Attends meetings of clubs or organizations: Not on file    Relationship status: Not on file  Other Topics Concern  . Not on file  Social History Narrative  . Not on file    Family History  Problem Relation Age of Onset  . Hyperlipidemia Mother   . Hypertension Mother   . Mental illness Father   . Heart disease Maternal Aunt   . Diabetes Maternal Grandmother     Review of Systems  Constitutional: Negative for chills and fever.  Respiratory: Negative for cough, shortness of breath and wheezing.   Cardiovascular: Positive for leg swelling (sometimes). Negative for chest pain and palpitations.  Musculoskeletal: Positive for arthralgias.  Neurological: Positive for light-headedness. Negative for headaches.       Objective:   Vitals:   10/13/17 1611  BP: 130/86  Pulse: 91  Resp: 16  Temp: 98 F (36.7 C)  SpO2: 97%   BP Readings from Last 3 Encounters:  10/13/17 130/86  04/13/17 126/86  03/22/17 128/80   Wt Readings from Last 3 Encounters:  10/13/17 294 lb (133.4 kg)  04/13/17 283 lb (128.4 kg)  03/22/17 282 lb 6.4 oz (128.1 kg)   Body mass index is 39.87 kg/m.   Physical Exam    Constitutional: Appears well-developed and well-nourished. No distress.  HENT:  Head: Normocephalic and atraumatic.  Neck: Neck supple. No tracheal deviation present. No thyromegaly present.  No cervical lymphadenopathy Cardiovascular: Normal rate, regular rhythm and normal heart sounds.   No murmur heard. No carotid bruit .  No edema Pulmonary/Chest: Effort normal and breath sounds normal. No respiratory distress. No has no wheezes. No rales.  Skin: Skin is warm and dry. Not diaphoretic.  Psychiatric: Normal mood and affect. Behavior is normal.      Assessment & Plan:    See Problem List for Assessment and Plan of chronic medical  problems.

## 2017-10-16 ENCOUNTER — Encounter: Payer: Self-pay | Admitting: Internal Medicine

## 2017-10-16 LAB — TESTOSTERONE, FREE & TOTAL
FREE TESTOSTERONE: 47.7 pg/mL (ref 35.0–155.0)
TESTOSTERONE, TOTAL, LC-MS-MS: 296 ng/dL (ref 250–1100)

## 2017-10-16 LAB — PSA, TOTAL AND FREE
PSA, % FREE: 50 % (ref >25)
PSA, FREE: 0.2 ng/mL
PSA, Total: 0.4 ng/mL (ref <=4.0)

## 2017-11-02 ENCOUNTER — Other Ambulatory Visit: Payer: Self-pay | Admitting: Family Medicine

## 2017-11-03 NOTE — Telephone Encounter (Signed)
Dr. Tamala Julian has not seen this patient since 2017.

## 2017-11-04 LAB — HM DIABETES EYE EXAM

## 2017-11-05 ENCOUNTER — Other Ambulatory Visit: Payer: Self-pay | Admitting: Internal Medicine

## 2017-11-05 MED ORDER — SPIRONOLACTONE 25 MG PO TABS: 25.0000 mg | ORAL_TABLET | Freq: Every day | ORAL | 3 refills | Status: DC

## 2017-11-25 ENCOUNTER — Encounter: Payer: Self-pay | Admitting: Internal Medicine

## 2017-12-31 ENCOUNTER — Ambulatory Visit (AMBULATORY_SURGERY_CENTER): Payer: Self-pay

## 2017-12-31 ENCOUNTER — Encounter: Payer: Self-pay | Admitting: Gastroenterology

## 2017-12-31 VITALS — Ht 72.0 in | Wt 295.2 lb

## 2017-12-31 DIAGNOSIS — Z1211 Encounter for screening for malignant neoplasm of colon: Secondary | ICD-10-CM

## 2017-12-31 MED ORDER — NA SULFATE-K SULFATE-MG SULF 17.5-3.13-1.6 GM/177ML PO SOLN: 1.0000 | Freq: Once | ORAL | 0 refills | Status: AC

## 2017-12-31 NOTE — Progress Notes (Signed)
Per pt, no allergies to soy or egg products.Pt not taking any weight loss meds or using  O2 at home.  Emmi video sent to patient's email 

## 2018-01-05 ENCOUNTER — Other Ambulatory Visit: Payer: Self-pay | Admitting: Internal Medicine

## 2018-01-14 ENCOUNTER — Ambulatory Visit (AMBULATORY_SURGERY_CENTER): Payer: BLUE CROSS/BLUE SHIELD | Admitting: Gastroenterology

## 2018-01-14 ENCOUNTER — Encounter: Payer: Self-pay | Admitting: Gastroenterology

## 2018-01-14 VITALS — BP 114/81 | HR 73 | Temp 97.5°F | Resp 20 | Ht 72.0 in | Wt 294.0 lb

## 2018-01-14 DIAGNOSIS — K635 Polyp of colon: Secondary | ICD-10-CM

## 2018-01-14 DIAGNOSIS — D125 Benign neoplasm of sigmoid colon: Secondary | ICD-10-CM | POA: Diagnosis not present

## 2018-01-14 DIAGNOSIS — Z1211 Encounter for screening for malignant neoplasm of colon: Secondary | ICD-10-CM | POA: Diagnosis not present

## 2018-01-14 DIAGNOSIS — K621 Rectal polyp: Secondary | ICD-10-CM | POA: Diagnosis not present

## 2018-01-14 DIAGNOSIS — D122 Benign neoplasm of ascending colon: Secondary | ICD-10-CM

## 2018-01-14 DIAGNOSIS — D128 Benign neoplasm of rectum: Secondary | ICD-10-CM

## 2018-01-14 DIAGNOSIS — D123 Benign neoplasm of transverse colon: Secondary | ICD-10-CM

## 2018-01-14 DIAGNOSIS — D129 Benign neoplasm of anus and anal canal: Secondary | ICD-10-CM

## 2018-01-14 DIAGNOSIS — D124 Benign neoplasm of descending colon: Secondary | ICD-10-CM

## 2018-01-14 MED ORDER — SODIUM CHLORIDE 0.9 % IV SOLN: 500.0000 mL | Freq: Once | INTRAVENOUS | Status: DC

## 2018-01-14 NOTE — Progress Notes (Signed)
A/ox3 pleased with MAC, report to RN 

## 2018-01-14 NOTE — Patient Instructions (Signed)
Please read handouts on Polyps and Hemorrhoids. Continue present medications. Resume previous diet. Increase fiber in diet.     YOU HAD AN ENDOSCOPIC PROCEDURE TODAY AT [facility]:   Refer to the procedure report that was given to you for any specific questions about what was found during the examination.  If the procedure report does not answer your questions, please call your gastroenterologist to clarify.  If you requested that your care partner not be given the details of your procedure findings, then the procedure report has been included in a sealed envelope for you to review at your convenience later.  YOU SHOULD EXPECT: Some feelings of bloating in the abdomen. Passage of more gas than usual.  Walking can help get rid of the air that was put into your GI tract during the procedure and reduce the bloating. If you had a lower endoscopy (such as a colonoscopy or flexible sigmoidoscopy) you may notice spotting of blood in your stool or on the toilet paper. If you underwent a bowel prep for your procedure, you may not have a normal bowel movement for a few days.  Please Note:  You might notice some irritation and congestion in your nose or some drainage.  This is from the oxygen used during your procedure.  There is no need for concern and it should clear up in a day or so.  SYMPTOMS TO REPORT IMMEDIATELY:   Following lower endoscopy (colonoscopy or flexible sigmoidoscopy):  Excessive amounts of blood in the stool  Significant tenderness or worsening of abdominal pains  Swelling of the abdomen that is new, acute  Fever of 100F or higher    For urgent or emergent issues, a gastroenterologist can be reached at any hour by calling [phone].   DIET:  We do recommend a small meal at first, but then you may proceed to your regular diet.  Drink plenty of fluids but you should avoid alcoholic beverages for 24 hours.  ACTIVITY:  You should plan to take it easy for the  rest of today and you should NOT DRIVE or use heavy machinery until tomorrow (because of the sedation medicines used during the test).    FOLLOW UP: Our staff will call the number listed on your records the next business day following your procedure to check on you and address any questions or concerns that you may have regarding the information given to you following your procedure. If we do not reach you, we will leave a message.  However, if you are feeling well and you are not experiencing any problems, there is no need to return our call.  We will assume that you have returned to your regular daily activities without incident.  If any biopsies were taken you will be contacted by phone or by letter within the next 1-3 weeks.  Please call us at [phone] if you have not heard about the biopsies in 3 weeks.    SIGNATURES/CONFIDENTIALITY: You and/or your care partner have signed paperwork which will be entered into your electronic medical record.  These signatures attest to the fact that that the information above on your After Visit Summary has been reviewed and is understood.  Full responsibility of the confidentiality of this discharge information lies with you and/or your care-partner.

## 2018-01-14 NOTE — Op Note (Signed)
[place] Patient Name: Benjamin Chen Procedure Date: 01/14/2018 8:33 AM MRN: [id number] Endoscopist: Gerrit Heck , MD Age: 52 Referring MD:  Date of Birth: 04-23-1966 Gender: Male Account #: [id number] Procedure:                Colonoscopy Indications:              Screening for colorectal malignant neoplasm,                            Screening for colorectal malignant neoplasm (last                            colonoscopy was 10 years ago). He is otherwise                            without GI symptoms and no family history of CRC. Medicines:                Monitored Anesthesia Care Procedure:                Pre-Anesthesia Assessment:                           - Prior to the procedure, a History and Physical                            was performed, and patient medications and                            allergies were reviewed. The patient's tolerance of                            previous anesthesia was also reviewed. The risks                            and benefits of the procedure and the sedation                            options and risks were discussed with the patient.                            All questions were answered, and informed consent                            was obtained. Prior Anticoagulants: The patient has                            taken no previous anticoagulant or antiplatelet                            agents. ASA Grade Assessment: III - A patient with                            severe systemic disease. After reviewing the risks  and benefits, the patient was deemed in                            satisfactory condition to undergo the procedure.                           After obtaining informed consent, the colonoscope                            was passed under direct vision. Throughout the                            procedure, the patient's blood pressure, pulse, and                            oxygen  saturations were monitored continuously. The                            Colonoscope was introduced through the anus and                            advanced to the the cecum, identified by                            appendiceal orifice and ileocecal valve. The                            colonoscopy was performed without difficulty. The                            patient tolerated the procedure well. The quality                            of the bowel preparation was adequate. Scope In: 8:36:41 AM Scope Out: 9:02:41 AM Scope Withdrawal Time: 0 hours 20 minutes 59 seconds  Total Procedure Duration: 0 hours 26 minutes 0 seconds  Findings:                 The perianal and digital rectal examinations were                            normal.                           A 4 mm polyp was found in the transverse colon. The                            polyp was sessile. The polyp was removed with a                            cold snare. Resection and retrieval were complete.                            Estimated blood loss was minimal.  A 5 mm polyp was found in the ascending colon. The                            polyp was sessile. The polyp was removed with a                            cold snare. Resection and retrieval were complete.                            Estimated blood loss was minimal.                           A 5 mm polyp was found in the descending colon. The                            polyp was sessile. The polyp was removed with a                            cold snare. Resection and retrieval were complete.                            Estimated blood loss was minimal.                           A 4 mm polyp was found in the sigmoid colon. The                            polyp was sessile. The polyp was removed with a                            cold snare. Resection and retrieval were complete.                            Estimated blood loss was minimal.                            A 8 mm polyp was found in the sigmoid colon. The                            polyp was pedunculated. The polyp was removed with                            a hot snare. Resection and retrieval were complete.                            Estimated blood loss: none.                           A few sessile polyps were found in the rectum                            (benign-appearing lesion). The polyps were 1 to 2  mm in size. These polyps were removed with a cold                            snare. Resection and retrieval were complete.                            Estimated blood loss was minimal.                           Non-bleeding internal hemorrhoids were found during                            retroflexion. The hemorrhoids were small. Complications:            No immediate complications. Estimated Blood Loss:     Estimated blood loss was minimal. Impression:               - One 4 mm polyp in the transverse colon, removed                            with a cold snare. Resected and retrieved.                           - One 5 mm polyp in the ascending colon, removed                            with a cold snare. Resected and retrieved.                           - One 5 mm polyp in the descending colon, removed                            with a cold snare. Resected and retrieved.                           - One 4 mm polyp in the sigmoid colon, removed with                            a cold snare. Resected and retrieved.                           - One 8 mm polyp in the sigmoid colon, removed with                            a hot snare. Resected and retrieved.                           - A few benign appearing 1 to 2 mm polyps in the                            rectum, removed with a cold snare. Resected and  retrieved.                           - Non-bleeding internal hemorrhoids. Recommendation:           - Patient has a contact  number available for                            emergencies. The signs and symptoms of potential                            delayed complications were discussed with the                            patient. Return to normal activities tomorrow.                            Written discharge instructions were provided to the                            patient.                           - Resume previous diet today.                           - Continue present medications.                           - Await pathology results.                           - Repeat colonoscopy in 3 years for surveillance.                           - Return to GI clinic PRN.                           - Internal hemorrhoids were noted on this study and                            may be amenable to hemorrhoid band ligation. If you                            are interested in further treatment of these                            hemorrhoids with band ligation, please contact my                            clinic to set up an appointment for evaluation and                            treatment.                           -  Use fiber, for example Citrucel, Fibercon, Konsyl                            or Metamucil. Gerrit Heck, MD 01/14/2018 9:11:49 AM

## 2018-01-14 NOTE — Progress Notes (Signed)
Called to room to assist during endoscopic procedure.  Patient ID and intended procedure confirmed with present staff. Received instructions for my participation in the procedure from the performing physician.  

## 2018-01-17 ENCOUNTER — Telehealth: Payer: Self-pay | Admitting: *Deleted

## 2018-01-17 NOTE — Telephone Encounter (Signed)
Left message on f/u call 

## 2018-01-17 NOTE — Telephone Encounter (Signed)
  Follow up Call-  Call back number 01/14/2018  Post procedure Call Back phone  # [phone]  Permission to leave phone message Yes     Patient questions:  Do you have a fever, pain , or abdominal swelling? No. Pain Score  0 *  Have you tolerated food without any problems? Yes.    Have you been able to return to your normal activities? Yes.    Do you have any questions about your discharge instructions: Diet   No. Medications  No. Follow up visit  No.  Do you have questions or concerns about your Care? No.  Actions: * If pain score is 4 or above: No action needed, pain <4.

## 2018-01-18 ENCOUNTER — Encounter: Payer: Self-pay | Admitting: Gastroenterology

## 2018-01-20 ENCOUNTER — Other Ambulatory Visit: Payer: Self-pay | Admitting: Family Medicine

## 2018-01-21 NOTE — Telephone Encounter (Signed)
Refill denied. Pt needs an appt. 

## 2018-01-24 ENCOUNTER — Encounter: Payer: Self-pay | Admitting: Internal Medicine

## 2018-03-21 ENCOUNTER — Other Ambulatory Visit: Payer: Self-pay | Admitting: Family Medicine

## 2018-03-26 ENCOUNTER — Other Ambulatory Visit: Payer: Self-pay

## 2018-03-26 ENCOUNTER — Emergency Department (HOSPITAL_COMMUNITY): Payer: BLUE CROSS/BLUE SHIELD

## 2018-03-26 ENCOUNTER — Emergency Department (HOSPITAL_COMMUNITY)
Admission: EM | Admit: 2018-03-26 | Discharge: 2018-03-27 | Disposition: A | Discharge status: Home / Self Care | Payer: BLUE CROSS/BLUE SHIELD | Attending: Emergency Medicine | Admitting: Emergency Medicine

## 2018-03-26 DIAGNOSIS — R1011 Right upper quadrant pain: Secondary | ICD-10-CM | POA: Insufficient documentation

## 2018-03-26 DIAGNOSIS — R1031 Right lower quadrant pain: Secondary | ICD-10-CM

## 2018-03-26 DIAGNOSIS — E119 Type 2 diabetes mellitus without complications: Secondary | ICD-10-CM | POA: Diagnosis not present

## 2018-03-26 DIAGNOSIS — K8080 Other cholelithiasis without obstruction: Secondary | ICD-10-CM

## 2018-03-26 DIAGNOSIS — Z79899 Other long term (current) drug therapy: Secondary | ICD-10-CM | POA: Insufficient documentation

## 2018-03-26 DIAGNOSIS — I10 Essential (primary) hypertension: Secondary | ICD-10-CM | POA: Diagnosis not present

## 2018-03-26 LAB — COMPREHENSIVE METABOLIC PANEL
ALK PHOS: 83 U/L (ref 38–126)
ALT: 34 U/L (ref 0–44)
AST: 21 U/L (ref 15–41)
Albumin: 3.7 g/dL (ref 3.5–5.0)
Anion gap: 6 (ref 5–15)
BILIRUBIN TOTAL: 0.8 mg/dL (ref 0.3–1.2)
BUN: 16 mg/dL (ref 6–20)
CHLORIDE: 103 mmol/L (ref 98–111)
CO2: 26 mmol/L (ref 22–32)
Calcium: 9.2 mg/dL (ref 8.9–10.3)
Creatinine, Ser: 1.23 mg/dL (ref 0.61–1.24)
Glucose, Bld: 167 mg/dL — ABNORMAL HIGH (ref 70–99)
Potassium: 4.3 mmol/L (ref 3.5–5.1)
SODIUM: 135 mmol/L (ref 135–145)
TOTAL PROTEIN: 6.5 g/dL (ref 6.5–8.1)

## 2018-03-26 LAB — CBC
HEMATOCRIT: 41.9 % (ref 39.0–52.0)
Hemoglobin: 14.1 g/dL (ref 13.0–17.0)
MCH: 25.8 pg — ABNORMAL LOW (ref 26.0–34.0)
MCHC: 33.7 g/dL (ref 30.0–36.0)
MCV: 76.6 fL — ABNORMAL LOW (ref 80.0–100.0)
Platelets: 356 10*3/uL (ref 150–400)
RBC: 5.47 MIL/uL (ref 4.22–5.81)
RDW: 14.4 % (ref 11.5–15.5)
WBC: 8.9 10*3/uL (ref 4.0–10.5)
nRBC: 0 % (ref 0.0–0.2)

## 2018-03-26 LAB — URINALYSIS, ROUTINE W REFLEX MICROSCOPIC
BILIRUBIN URINE: NEGATIVE
GLUCOSE, UA: NEGATIVE mg/dL
Hgb urine dipstick: NEGATIVE
KETONES UR: NEGATIVE mg/dL
Leukocytes, UA: NEGATIVE
NITRITE: NEGATIVE
PH: 5 (ref 5.0–8.0)
PROTEIN: NEGATIVE mg/dL
Specific Gravity, Urine: 1.026 (ref 1.005–1.030)

## 2018-03-26 LAB — I-STAT CG4 LACTIC ACID, ED: Lactic Acid, Venous: 2.26 mmol/L (ref 0.5–1.9)

## 2018-03-26 LAB — LIPASE, BLOOD: Lipase: 37 U/L (ref 11–51)

## 2018-03-26 MED ORDER — IOHEXOL 300 MG/ML  SOLN
100.0000 mL | Freq: Once | INTRAMUSCULAR | Status: AC | PRN
  Administered 2018-03-26: 100 mL via INTRAVENOUS

## 2018-03-26 MED ORDER — SODIUM CHLORIDE 0.9 % IV BOLUS
1000.0000 mL | Freq: Once | INTRAVENOUS | Status: AC
  Administered 2018-03-26: 1000 mL via INTRAVENOUS

## 2018-03-26 NOTE — Discharge Instructions (Addendum)
Please see the information and instructions below regarding your visit.  Your diagnoses today include:  1. RLQ abdominal pain   2. RUQ pain     Tests performed today include: See side panel of your discharge paperwork for testing performed today. Vital signs are listed at the bottom of these instructions.   Your work-up is very reassuring today.  No signs of appendicitis or an obstructed hernia.  Medications prescribed:    Take any prescribed medications only as prescribed, and any over the counter medications only as directed on the packaging.  I recommend naproxen (Aleve), a non-steroidal anti-inflammatory agent (NSAID) for pain. You may take 500 mg every 12 hours as needed for pain. If still requiring this medication around the clock for acute pain after 10 days, please see your primary healthcare provider.  Women who are pregnant, breastfeeding, or planning on becoming pregnant should not take non-steroidal anti-inflammatories such as Advil and Aleve. Tylenol is a safe over the counter pain reliever in pregnant women.  You may combine this medication with Tylenol, 650 mg every 6 hours, so you are receiving something for pain every 3 hours.  This is not a long-term medication unless under the care and direction of your primary provider. Taking this medication long-term and not under the supervision of a healthcare provider could increase the risk of stomach ulcers, kidney problems, and cardiovascular problems such as high blood pressure.    Home care instructions:  Please follow any educational materials contained in this packet.   Follow-up instructions: Please follow-up with your primary care provider in one week for further evaluation of your symptoms if they are not completely improved. You may need further imaging.   Return instructions:  Please return to the Emergency Department if you experience worsening symptoms.  Please return to the emergency department if you develop  any sudden worsening of pain, pain with vomiting, or pain with fever or chills. Please return if you have any other emergent concerns.  Additional Information:   Your vital signs today were: BP 114/82    Pulse 82    Temp 97.9 F (36.6 C) (Oral)    Resp (!) 23    Ht 6' (1.829 m)    Wt 127 kg    SpO2 98%    BMI 37.97 kg/m  If your blood pressure (BP) was elevated on multiple readings during this visit above 130 for the top number or above 80 for the bottom number, please have this repeated by your primary care provider within one month. --------------  Thank you for allowing [place] to participate in your care today.

## 2018-03-26 NOTE — ED Triage Notes (Signed)
Here c/o abdominal pain on lower right side radiating to back.

## 2018-03-26 NOTE — ED Notes (Signed)
Patient transported to [facility] 

## 2018-03-26 NOTE — ED Notes (Signed)
Pt returned from ultrasound.  Pt rates pain a #9 on pain scale 0/10 in RLQ

## 2018-03-26 NOTE — ED Provider Notes (Signed)
[facility] DEPART[facility] Provider Note   CSN: [id number] Arrival date & time: 03/26/18  1948     History   Chief Complaint Chief Complaint  Patient presents with  . Abdominal Pain    HPI Benjamin Chen is a 52 y.o. male.  HPI  Patient is a 52 year old male with a history of type 2 diabetes mellitus, hyperlipidemia, hypertension, and OSA presenting for right lower quadrant and right-sided abdominal pain.  He reports that it is been going on for several months, however it worsened over the past 2 weeks.  He reports that the pain starts in the lower abdomen with a little area, and radiates into the back.  He reports it is worse with movement or with standing and dependent pressure.  Denies any change with eating.  Patient reports that it radiates to the testicle.  He reports she has had testicular pain for years since he was diagnosed with "epididymitis".  He reports he was offered surgery due to "a bump" in the testicles, however did not pursue this.  Patient denies any dysuria, urgency, frequency, or penile discharge.  Patient reports normal bowel movements.  Patient denies any fever or chills.  Patient denies any abdominal surgical history.  Patient does not have any history of known hernia.  Past Medical History:  Diagnosis Date  . Allergy    seasonal  . Diabetes mellitus without complication ([place])   . GERD (gastroesophageal reflux disease)    occasional heartburn/no meds  . Gout   . Hyperlipidemia   . Hypertension   . Sleep apnea    uses a c-pap    Patient Active Problem List   Diagnosis Date Noted  . Fatigue 10/13/2017  . Anal pain 04/13/2017  . Obesity, morbid ([place]) 01/12/2017  . Chest pain 10/07/2016  . Erectile dysfunction 10/07/2016  . Gout 01/29/2016  . Arthralgia 01/24/2016  . Hyperlipidemia 01/24/2016  . Essential hypertension, benign 12/30/2015  . Diabetes ([place]) 12/30/2015  . OSA (obstructive sleep apnea) 12/30/2015  .  Depression 12/30/2015  . Bilateral primary osteoarthritis of knee 12/30/2015  . Joint pain 12/30/2015    Past Surgical History:  Procedure Laterality Date  . KNEE ARTHROSCOPY Right    right - torn meniscus  . KNEE ARTHROSCOPY Left    left - torn meniscus        Home Medications    Prior to Admission medications   Medication Sig Start Date End Date Taking? Authorizing Provider  allopurinol (ZYLOPRIM) 100 MG tablet TAKE 2 TABLETS BY MOUTH EVERY DAY 11/03/17   Binnie Rail, MD  amLODipine (NORVASC) 5 MG tablet TAKE 1 TABLET BY MOUTH EVERY DAY 08/02/17   Burns, Claudina Lick, MD  meloxicam (MOBIC) 15 MG tablet TAKE 1 TABLET DAILY AS NEEDED WITH FOOD FOR PAIN AND INFLAMATION 02/27/17   [provider]  quinapril (ACCUPRIL) 20 MG tablet TAKE 1 TABLET BY MOUTH TWICE A DAY 01/05/18   Binnie Rail, MD  Saxagliptin-Metformin (KOMBIGLYZE XR) 09-998 MG TB24 TAKE 1 TABLET EVERY DAY WITH MEAL 01/05/18   Binnie Rail, MD  spironolactone (ALDACTONE) 25 MG tablet Take 1 tablet (25 mg total) by mouth daily. 11/05/17   Burns, Claudina Lick, MD  tiZANidine (ZANAFLEX) 2 MG tablet TAKE 1 TABLET BY MOUTH EVERY 12 HOURS AS NEEDED PAIN/SPASM 04/05/17   [provider]  TURMERIC CURCUMIN PO Take by mouth daily.     [provider]  Vitamin D, Ergocalciferol, (DRISDOL) 50000 units CAPS capsule TAKE  1 CAPSULE BY MOUTH EVERY 7 (SEVEN) DAYS. 07/05/17   Lyndal Pulley, DO    Family History Family History  Problem Relation Age of Onset  . Hyperlipidemia Mother   . Hypertension Mother   . Mental illness Father   . Heart disease Maternal Aunt   . Diabetes Maternal Grandmother   . Heart disease Sister   . Hypertension Sister   . Thyroid disease Brother     Social History Social History   Tobacco Use  . Smoking status: Never Smoker  . Smokeless tobacco: Never Used  Substance Use Topics  . Alcohol use: Yes    Alcohol/week: 2.0 standard drinks    Types: 2 Standard drinks or equivalent  per week  . Drug use: No     Allergies   Penicillins   Review of Systems Review of Systems  Constitutional: Negative for chills and fever.  HENT: Negative for congestion and sore throat.   Eyes: Negative for visual disturbance.  Respiratory: Negative for cough, chest tightness and shortness of breath.   Cardiovascular: Negative for chest pain, palpitations and leg swelling.  Gastrointestinal: Positive for abdominal pain. Negative for nausea and vomiting.  Genitourinary: Negative for discharge, dysuria, flank pain, penile pain, penile swelling and scrotal swelling.  Musculoskeletal: Negative for back pain and myalgias.  Skin: Negative for rash.  Neurological: Negative for dizziness, syncope, light-headedness and headaches.     Physical Exam Updated Vital Signs BP 120/81   Pulse 89   Temp 97.9 F (36.6 C) (Oral)   Resp (!) 31   Ht 6' (1.829 m)   Wt 127 kg   SpO2 99%   BMI 37.97 kg/m   Physical Exam  Constitutional: He appears well-developed and well-nourished. No distress.  HENT:  Head: Normocephalic and atraumatic.  Mouth/Throat: Oropharynx is clear and moist.  Eyes: Pupils are equal, round, and reactive to light. Conjunctivae and EOM are normal.  Neck: Normal range of motion. Neck supple.  Cardiovascular: Normal rate, regular rhythm, S1 normal and S2 normal.  No murmur heard. Pulmonary/Chest: Effort normal and breath sounds normal. He has no wheezes. He has no rales.  Abdominal: Soft. He exhibits no distension. There is tenderness. There is no guarding.  Mild tenderness to palpation of the lower abdomen just superior to the right inguinal ligament.  Genitourinary:  Genitourinary Comments: GU examination performed with nurse tech chaperone present.  Left testicle is without erythema.   Musculoskeletal: Normal range of motion. He exhibits no edema or deformity.  Lymphadenopathy:    He has no cervical adenopathy.  Neurological: He is alert.  Cranial nerves grossly  intact. Patient moves extremities symmetrically and with good coordination.  Skin: Skin is warm and dry. No rash noted. No erythema.  Psychiatric: He has a normal mood and affect. His behavior is normal. Judgment and thought content normal.  Nursing note and vitals reviewed.    ED Treatments / Results  Labs (all labs ordered are listed, but only abnormal results are displayed) Labs Reviewed  COMPREHENSIVE METABOLIC PANEL - Abnormal; Notable for the following components:      Result Value   Glucose, Bld 167 (*)    All other components within normal limits  CBC - Abnormal; Notable for the following components:   MCV 76.6 (*)    MCH 25.8 (*)    All other components within normal limits  I-STAT CG4 LACTIC ACID, ED - Abnormal; Notable for the following components:   Lactic Acid, Venous 2.26 (*)  All other components within normal limits  LIPASE, BLOOD  URINALYSIS, ROUTINE W REFLEX MICROSCOPIC  I-STAT CG4 LACTIC ACID, ED    EKG None  Radiology Ct Abdomen Pelvis W Contrast  Result Date: 03/26/2018 CLINICAL DATA:  Abdominal pain on lower right side radiating to back x 2-3 weeks. No nausea, vomiting, diarrhea or fever. EXAM: CT ABDOMEN AND PELVIS WITH CONTRAST TECHNIQUE: Multidetector CT imaging of the abdomen and pelvis was performed using the standard protocol following bolus administration of intravenous contrast. CONTRAST:  156mL OMNIPAQUE IOHEXOL 300 MG/ML  SOLN COMPARISON:  None. FINDINGS: Lower chest: No acute abnormality. Hepatobiliary: Low attenuation of the liver as can be seen with hepatic steatosis. No focal liver abnormality is seen. Cholelithiasis without gallbladder wall thickening or surrounding inflammatory changes. Pancreas: Unremarkable. No pancreatic ductal dilatation or surrounding inflammatory changes. Spleen: Normal in size without focal abnormality. Adrenals/Urinary Tract: Adrenal glands are unremarkable. Kidneys are normal, without renal calculi, focal lesion, or  hydronephrosis. Bladder is unremarkable. Stomach/Bowel: Stomach is within normal limits. Appendix appears normal. No evidence of bowel wall thickening, distention, or inflammatory changes. Vascular/Lymphatic: No significant vascular findings are present. No enlarged abdominal or pelvic lymph nodes. Reproductive: Prostate is unremarkable. Other: No abdominal wall hernia or abnormality. No abdominopelvic ascites. Musculoskeletal: No acute or significant osseous findings. IMPRESSION: 1. No acute abdominal or pelvic pathology. 2. Cholelithiasis. 3. Hepatic steatosis. Electronically Signed   By: Kathreen Devoid   On: 03/26/2018 22:10    Procedures Procedures (including critical care time)  Medications Ordered in ED Medications  sodium chloride 0.9 % bolus 1,000 mL (1,000 mLs Intravenous New Bag/Given 03/26/18 2215)  sodium chloride 0.9 % bolus 1,000 mL (0 mLs Intravenous Stopped 03/26/18 2208)  iohexol (OMNIPAQUE) 300 MG/ML solution 100 mL (100 mLs Intravenous Contrast Given 03/26/18 2147)     Initial Impression / Assessment and Plan / ED Course  I have reviewed the triage vital signs and the nursing notes.  Pertinent labs & imaging results that were available during my care of the patient were reviewed by me and considered in my medical decision making (see chart for details).  Clinical Course as of Mar 26 2217  Sat Mar 26, 2018  2042 Patient declines any pain medication.   [AM]  2123 Noted. Obtaining scan to r/o incarcerated hernia.   Lactic Acid, Venous(!!): 2.26 [AM]    Clinical Course User Index [AM] Albesa Seen, PA-C    Patient is nontoxic-appearing, afebrile, heme dynamically stable, and in no acute distress.  Abdomen is benign and nonsurgical.  Testicular exam is not concerning for torsion nor epididymitis.  Different diagnosis includes appendicitis, nephrolithiasis, incarcerated hernia, nerve impingement.  Lab work largely unremarkable.  No leukocytosis.  Normal renal and  hepatic function.  Urinalysis is negative.  Patient did have a lactic acidosis of 2.26.  Unclear etiology.  Patient has no signs of endorgan perfusion, no markers of SIRS, and does not appear to be septic.  CT scan of the abdomen and pelvis is without any acute inflammatory or infectious abnormality, but does note cholelithiasis.  Patient on repeat abdominal examination is having some discomfort in the right upper quadrant, so we will further clarify with right upper quadrant ultrasound.  Presentation is atypical for symptomatically cholelithiasis.   Case discussed with attending physician, Dr. Theotis Burrow, and on review of case, felt that lactic acidosis may be incidental, and not related to endorgan perfusion.  Patient does report missing multiple meals today. Will rehydrate, and not repeat per discussion  with attending physician.   Care signed out to [employer], PA-C at 11:06 PM to follow RUQ [place] and reassess.  Patient updated on plan of care.  Final Clinical Impressions(s) / ED Diagnoses   Final diagnoses:  RUQ pain  RLQ abdominal pain    ED Discharge Orders    None       Tamala Julian 03/26/18 2307    Little, Wenda Overland, MD 03/29/18 [phone]

## 2018-03-27 ENCOUNTER — Encounter: Payer: Self-pay | Admitting: Internal Medicine

## 2018-03-27 DIAGNOSIS — K760 Fatty (change of) liver, not elsewhere classified: Secondary | ICD-10-CM | POA: Insufficient documentation

## 2018-03-27 DIAGNOSIS — K8020 Calculus of gallbladder without cholecystitis without obstruction: Secondary | ICD-10-CM | POA: Insufficient documentation

## 2018-03-27 LAB — I-STAT CG4 LACTIC ACID, ED: Lactic Acid, Venous: 0.99 mmol/L (ref 0.5–1.9)

## 2018-03-27 MED ORDER — HYDROCODONE-ACETAMINOPHEN 5-325 MG PO TABS: 1.0000 | ORAL_TABLET | Freq: Four times a day (QID) | ORAL | 0 refills | Status: DC | PRN

## 2018-03-27 MED ORDER — ONDANSETRON 4 MG PO TBDP: 4.0000 mg | ORAL_TABLET | Freq: Three times a day (TID) | ORAL | 0 refills | Status: DC | PRN

## 2018-03-27 NOTE — ED Provider Notes (Signed)
Patient with right sided abdominal pain.  CT shows cholelithiasis.   [place] negative for cholecystitis.   DC to home with gen surg f/u and return precautions.   Montine Circle, PA-C 03/27/18 0221    Merrily Pew, MD 03/27/18 [phone]

## 2018-04-17 ENCOUNTER — Other Ambulatory Visit: Payer: Self-pay | Admitting: Internal Medicine

## 2018-04-19 ENCOUNTER — Ambulatory Visit (INDEPENDENT_AMBULATORY_CARE_PROVIDER_SITE_OTHER): Payer: BLUE CROSS/BLUE SHIELD | Admitting: Internal Medicine

## 2018-04-19 ENCOUNTER — Other Ambulatory Visit (INDEPENDENT_AMBULATORY_CARE_PROVIDER_SITE_OTHER): Payer: BLUE CROSS/BLUE SHIELD

## 2018-04-19 ENCOUNTER — Encounter: Payer: Self-pay | Admitting: Internal Medicine

## 2018-04-19 VITALS — BP 134/92 | HR 89 | Temp 98.4°F | Resp 16 | Ht 72.0 in | Wt 293.0 lb

## 2018-04-19 DIAGNOSIS — E7849 Other hyperlipidemia: Secondary | ICD-10-CM | POA: Diagnosis not present

## 2018-04-19 DIAGNOSIS — I10 Essential (primary) hypertension: Secondary | ICD-10-CM | POA: Diagnosis not present

## 2018-04-19 DIAGNOSIS — Z0000 Encounter for general adult medical examination without abnormal findings: Secondary | ICD-10-CM | POA: Diagnosis not present

## 2018-04-19 DIAGNOSIS — E119 Type 2 diabetes mellitus without complications: Secondary | ICD-10-CM

## 2018-04-19 DIAGNOSIS — M109 Gout, unspecified: Secondary | ICD-10-CM

## 2018-04-19 LAB — LIPID PANEL
Cholesterol: 181 mg/dL (ref 0–200)
HDL: 29.5 mg/dL — AB (ref >39.00)
NonHDL: 151.33
TRIGLYCERIDES: 204 mg/dL — AB (ref 0.0–149.0)
Total CHOL/HDL Ratio: 6
VLDL: 40.8 mg/dL — ABNORMAL HIGH (ref 0.0–40.0)

## 2018-04-19 LAB — TSH: TSH: 1.53 u[IU]/mL (ref 0.35–4.50)

## 2018-04-19 LAB — CBC WITH DIFFERENTIAL/PLATELET
BASOS ABS: 0.2 10*3/uL — AB (ref 0.0–0.1)
Basophils Relative: 1.4 % (ref 0.0–3.0)
Eosinophils Absolute: 0.1 10*3/uL (ref 0.0–0.7)
Eosinophils Relative: 1.2 % (ref 0.0–5.0)
HEMATOCRIT: 43.2 % (ref 39.0–52.0)
Hemoglobin: 14.7 g/dL (ref 13.0–17.0)
LYMPHS PCT: 30.2 % (ref 12.0–46.0)
Lymphs Abs: 3.4 10*3/uL (ref 0.7–4.0)
MCHC: 34.1 g/dL (ref 30.0–36.0)
MCV: 78 fl (ref 78.0–100.0)
MONOS PCT: 7.7 % (ref 3.0–12.0)
Monocytes Absolute: 0.9 10*3/uL (ref 0.1–1.0)
NEUTROS ABS: 6.6 10*3/uL (ref 1.4–7.7)
Neutrophils Relative %: 59.5 % (ref 43.0–77.0)
Platelets: 370 10*3/uL (ref 150.0–400.0)
RBC: 5.54 Mil/uL (ref 4.22–5.81)
RDW: 15 % (ref 11.5–15.5)
WBC: 11.1 10*3/uL — ABNORMAL HIGH (ref 4.0–10.5)

## 2018-04-19 LAB — COMPREHENSIVE METABOLIC PANEL
ALBUMIN: 4.2 g/dL (ref 3.5–5.2)
ALK PHOS: 95 U/L (ref 39–117)
ALT: 33 U/L (ref 0–53)
AST: 19 U/L (ref 0–37)
BILIRUBIN TOTAL: 0.6 mg/dL (ref 0.2–1.2)
BUN: 17 mg/dL (ref 6–23)
CALCIUM: 9.8 mg/dL (ref 8.4–10.5)
CO2: 30 mEq/L (ref 19–32)
Chloride: 102 mEq/L (ref 96–112)
Creatinine, Ser: 1.05 mg/dL (ref 0.40–1.50)
GFR: 95.76 mL/min (ref >60.00)
Glucose, Bld: 169 mg/dL — ABNORMAL HIGH (ref 70–99)
Potassium: 4 mEq/L (ref 3.5–5.1)
Sodium: 136 mEq/L (ref 135–145)
TOTAL PROTEIN: 7.1 g/dL (ref 6.0–8.3)

## 2018-04-19 LAB — LDL CHOLESTEROL, DIRECT: LDL DIRECT: 114 mg/dL

## 2018-04-19 LAB — URIC ACID: Uric Acid, Serum: 4.6 mg/dL (ref 4.0–7.8)

## 2018-04-19 LAB — HEMOGLOBIN A1C: HEMOGLOBIN A1C: 7.8 % — AB (ref 4.6–6.5)

## 2018-04-19 MED ORDER — PITAVASTATIN CALCIUM 1 MG PO TABS: 1.0000 mg | ORAL_TABLET | Freq: Every day | ORAL | 5 refills | Status: DC

## 2018-04-19 MED ORDER — AMLODIPINE BESYLATE 5 MG PO TABS: 5.0000 mg | ORAL_TABLET | Freq: Every day | ORAL | 1 refills | Status: DC

## 2018-04-19 MED ORDER — ALLOPURINOL 100 MG PO TABS: 200.0000 mg | ORAL_TABLET | Freq: Every day | ORAL | 1 refills | Status: DC

## 2018-04-19 NOTE — Assessment & Plan Note (Signed)
Check A1c He has improved his diet slightly, but is not exercising and has not lost weight We will adjust medication if necessary Wants to see a different podiatrist for diabetic foot care and callus removal-referred Stressed the importance of regular exercise and weight loss

## 2018-04-19 NOTE — Assessment & Plan Note (Addendum)
No recent gout symptoms Check uric acid level Taking allopurinol 200 mg daily

## 2018-04-19 NOTE — Progress Notes (Signed)
Subjective:    Patient ID: Benjamin Chen, male    DOB: 01/31/1966, 52 y.o.   MRN: [id number]  HPI He is here for a physical exam.   He was in the emergency room 03/26/2018 for abdominal pain and was diagnosed with cholelithiasis.  There is no evidence of cholecystitis.  He was advised to follow-up with surgery.  His pain that he was experiencing at that time was in his right lower quadrant and unlikely his gallbladder.  He denies any pain in his right upper quadrant or symptoms consistent with cholelithiasis.  He has no concerns.  Medications and allergies reviewed with patient and updated if appropriate.  Patient Active Problem List   Diagnosis Date Noted  . Fatty liver 03/27/2018  . Cholelithiasis 03/27/2018  . Fatigue 10/13/2017  . Anal pain 04/13/2017  . Obesity, morbid ([place]) 01/12/2017  . Chest pain 10/07/2016  . Erectile dysfunction 10/07/2016  . Gout 01/29/2016  . Arthralgia 01/24/2016  . Hyperlipidemia 01/24/2016  . Essential hypertension, benign 12/30/2015  . Diabetes ([place]) 12/30/2015  . OSA (obstructive sleep apnea) 12/30/2015  . Depression 12/30/2015  . Bilateral primary osteoarthritis of knee 12/30/2015  . Joint pain 12/30/2015    Current Outpatient Medications on File Prior to Visit  Medication Sig Dispense Refill  . allopurinol (ZYLOPRIM) 100 MG tablet TAKE 2 TABLETS BY MOUTH EVERY DAY (Patient taking differently: Take 200 mg by mouth daily. ) 60 tablet 5  . amLODipine (NORVASC) 5 MG tablet Take 1 tablet (5 mg total) by mouth daily. Annual appt is due must see provider for future refills 30 tablet 0  . quinapril (ACCUPRIL) 20 MG tablet TAKE 1 TABLET BY MOUTH TWICE A DAY (Patient taking differently: Take 20 mg by mouth 2 (two) times daily. ) 180 tablet 1  . Saxagliptin-Metformin (KOMBIGLYZE XR) 09-998 MG TB24 TAKE 1 TABLET EVERY DAY WITH MEAL (Patient taking differently: Take 1 tablet by mouth daily. ) 90 tablet 1  . spironolactone (ALDACTONE) 25 MG tablet  Take 1 tablet (25 mg total) by mouth daily. 90 tablet 3   No current facility-administered medications on file prior to visit.     Past Medical History:  Diagnosis Date  . Allergy    seasonal  . Diabetes mellitus without complication ([place])   . GERD (gastroesophageal reflux disease)    occasional heartburn/no meds  . Gout   . Hyperlipidemia   . Hypertension   . Sleep apnea    uses a c-pap    Past Surgical History:  Procedure Laterality Date  . KNEE ARTHROSCOPY Right    right - torn meniscus  . KNEE ARTHROSCOPY Left    left - torn meniscus    Social History   Socioeconomic History  . Marital status: Married    Spouse name: Not on file  . Number of children: Not on file  . Years of education: Not on file  . Highest education level: Not on file  Occupational History  . Not on file  Social Needs  . Financial resource strain: Not on file  . Food insecurity:    Worry: Not on file    Inability: Not on file  . Transportation needs:    Medical: Not on file    Non-medical: Not on file  Tobacco Use  . Smoking status: Never Smoker  . Smokeless tobacco: Never Used  Substance and Sexual Activity  . Alcohol use: Yes    Alcohol/week: 2.0 standard drinks    Types: 2 Standard drinks  or equivalent per week  . Drug use: No  . Sexual activity: Not on file  Lifestyle  . Physical activity:    Days per week: Not on file    Minutes per session: Not on file  . Stress: Not on file  Relationships  . Social connections:    Talks on phone: Not on file    Gets together: Not on file    Attends religious service: Not on file    Active member of club or organization: Not on file    Attends meetings of clubs or organizations: Not on file    Relationship status: Not on file  Other Topics Concern  . Not on file  Social History Narrative  . Not on file    Family History  Problem Relation Age of Onset  . Hyperlipidemia Mother   . Hypertension Mother   . Mental illness Father   .  Heart disease Maternal Aunt   . Diabetes Maternal Grandmother   . Heart disease Sister   . Hypertension Sister   . Thyroid disease Brother     Review of Systems  Constitutional: Negative for chills and fever.  Eyes: Negative for visual disturbance.  Respiratory: Negative for cough, shortness of breath and wheezing.   Cardiovascular: Positive for chest pain (3 episodes - normal stress 2018) and palpitations (occ with bending and getting up quick). Negative for leg swelling.  Gastrointestinal: Negative for abdominal pain, blood in stool, constipation, diarrhea and nausea.       No gerd  Genitourinary: Negative for dysuria and hematuria.  Musculoskeletal: Positive for arthralgias (knee pain, right shoulder sometimes).  Skin: Negative for color change and rash.  Neurological: Negative for light-headedness and headaches.  Psychiatric/Behavioral: Negative for dysphoric mood. The patient is not nervous/anxious.        Objective:   Vitals:   04/19/18 1451  BP: (!) 134/92  Pulse: 89  Resp: 16  Temp: 98.4 F (36.9 C)  SpO2: 99%   Filed Weights   04/19/18 1451  Weight: 293 lb (132.9 kg)   Body mass index is 39.74 kg/m.  Wt Readings from Last 3 Encounters:  04/19/18 293 lb (132.9 kg)  03/26/18 280 lb (127 kg)  01/14/18 294 lb (133.4 kg)     Physical Exam Constitutional: He appears well-developed and well-nourished. No distress.  HENT:  Head: Normocephalic and atraumatic.  Right Ear: External ear normal.  Left Ear: External ear normal.  Mouth/Throat: Oropharynx is clear and moist.  Normal ear canals and TM b/l  Eyes: Conjunctivae and EOM are normal.  Neck: Neck supple. No tracheal deviation present. No thyromegaly present.  No carotid bruit  Cardiovascular: Normal rate, regular rhythm, normal heart sounds and intact distal pulses.  No murmur heard.   No edema Pulmonary/Chest: Effort normal and breath sounds normal. No respiratory distress. He has no wheezes. He has no  rales.  Abdominal: Soft. He exhibits no distension. There is no tenderness.  Genitourinary: deferred  Lymphadenopathy:   He has no cervical adenopathy.  Skin: Skin is warm and dry. He is not diaphoretic.  Psychiatric: He has a normal mood and affect. His behavior is normal.         Assessment & Plan:   Physical exam: Screening blood work ordered Immunizations discussed pneumonia vaccine-deferred, discussed shingles vaccine Colonoscopy   up-to-date Eye exams   up-to-date EKG   10/2016 Exercise    none-stressed the importance of regular exercise Weight   stressed the importance of weight loss  Skin no concerns Substance abuse   none  See Problem List for Assessment and Plan of chronic medical problems.   Follow-up in 6 months

## 2018-04-19 NOTE — Assessment & Plan Note (Signed)
BP well controlled Current regimen effective and well tolerated Continue current medications at current doses cmp  

## 2018-04-19 NOTE — Patient Instructions (Addendum)
Tests ordered today. Your results will be released to [place] (or called to you) after review, usually within 72hours after test completion. If any changes need to be made, you will be notified at that same time.  All other Health Maintenance issues reviewed.   All recommended immunizations and age-appropriate screenings are up-to-date or discussed.  No immunizations administered today.   Medications reviewed and updated.  Changes include :   Start pitavastatin for your cholesterol.   Your prescription(s) have been submitted to your pharmacy. Please take as directed and contact our office if you believe you are having problem(s) with the medication(s).  A referral was ordered for podiatry  Please followup in 6 months    Health Maintenance, Male A healthy lifestyle and preventive care is important for your health and wellness. Ask your health care provider about what schedule of regular examinations is right for you. What should I know about weight and diet? Eat a Healthy Diet  Eat plenty of vegetables, fruits, whole grains, low-fat dairy products, and lean protein.  Do not eat a lot of foods high in solid fats, added sugars, or salt.  Maintain a Healthy Weight Regular exercise can help you achieve or maintain a healthy weight. You should:  Do at least 150 minutes of exercise each week. The exercise should increase your heart rate and make you sweat (moderate-intensity exercise).  Do strength-training exercises at least twice a week.  Watch Your Levels of Cholesterol and Blood Lipids  Have your blood tested for lipids and cholesterol every 5 years starting at 51 years of age. If you are at high risk for heart disease, you should start having your blood tested when you are 51 years old. You may need to have your cholesterol levels checked more often if: ? Your lipid or cholesterol levels are high. ? You are older than 51 years of age. ? You are at high risk for heart  disease.  What should I know about cancer screening? Many types of cancers can be detected early and may often be prevented. Lung Cancer  You should be screened every year for lung cancer if: ? You are a current smoker who has smoked for at least 30 years. ? You are a former smoker who has quit within the past 15 years.  Talk to your health care provider about your screening options, when you should start screening, and how often you should be screened.  Colorectal Cancer  Routine colorectal cancer screening usually begins at 51 years of age and should be repeated every 5-10 years until you are 51 years old. You may need to be screened more often if early forms of precancerous polyps or small growths are found. Your health care provider may recommend screening at an earlier age if you have risk factors for colon cancer.  Your health care provider may recommend using home test kits to check for hidden blood in the stool.  A small camera at the end of a tube can be used to examine your colon (sigmoidoscopy or colonoscopy). This checks for the earliest forms of colorectal cancer.  Prostate and Testicular Cancer  Depending on your age and overall health, your health care provider may do certain tests to screen for prostate and testicular cancer.  Talk to your health care provider about any symptoms or concerns you have about testicular or prostate cancer.  Skin Cancer  Check your skin from head to toe regularly.  Tell your health care provider about any new  moles or changes in moles, especially if: ? There is a change in a mole's size, shape, or color. ? You have a mole that is larger than a pencil eraser.  Always use sunscreen. Apply sunscreen liberally and repeat throughout the day.  Protect yourself by wearing long sleeves, pants, a wide-brimmed hat, and sunglasses when outside.  What should I know about heart disease, diabetes, and high blood pressure?  If you are 18-39 years  of age, have your blood pressure checked every 3-5 years. If you are 71 years of age or older, have your blood pressure checked every year. You should have your blood pressure measured twice-once when you are at a hospital or clinic, and once when you are not at a hospital or clinic. Record the average of the two measurements. To check your blood pressure when you are not at a hospital or clinic, you can use: ? An automated blood pressure machine at a pharmacy. ? A home blood pressure monitor.  Talk to your health care provider about your target blood pressure.  If you are between 40-11 years old, ask your health care provider if you should take aspirin to prevent heart disease.  Have regular diabetes screenings by checking your fasting blood sugar level. ? If you are at a normal weight and have a low risk for diabetes, have this test once every three years after the age of 68. ? If you are overweight and have a high risk for diabetes, consider being tested at a younger age or more often.  A one-time screening for abdominal aortic aneurysm (AAA) by ultrasound is recommended for men aged 17-75 years who are current or former smokers. What should I know about preventing infection? Hepatitis B If you have a higher risk for hepatitis B, you should be screened for this virus. Talk with your health care provider to find out if you are at risk for hepatitis B infection. Hepatitis C Blood testing is recommended for:  Everyone born from [birth year] through 1964.  Anyone with known risk factors for hepatitis C.  Sexually Transmitted Diseases (STDs)  You should be screened each year for STDs including gonorrhea and chlamydia if: ? You are sexually active and are younger than 51 years of age. ? You are older than 51 years of age and your health care provider tells you that you are at risk for this type of infection. ? Your sexual activity has changed since you were last screened and you are at an increased  risk for chlamydia or gonorrhea. Ask your health care provider if you are at risk.  Talk with your health care provider about whether you are at high risk of being infected with HIV. Your health care provider may recommend a prescription medicine to help prevent HIV infection.  What else can I do?  Schedule regular health, dental, and eye exams.  Stay current with your vaccines (immunizations).  Do not use any tobacco products, such as cigarettes, chewing tobacco, and e-cigarettes. If you need help quitting, ask your health care provider.  Limit alcohol intake to no more than 2 drinks per day. One drink equals 12 ounces of beer, 5 ounces of wine, or 1 ounces of hard liquor.  Do not use street drugs.  Do not share needles.  Ask your health care provider for help if you need support or information about quitting drugs.  Tell your health care provider if you often feel depressed.  Tell your health care provider if you  have ever been abused or do not feel safe at home. This information is not intended to replace advice given to you by your health care provider. Make sure you discuss any questions you have with your health care provider. Document Released: 10/24/2007 Document Revised: 12/25/2015 Document Reviewed: 01/29/2015 Elsevier Interactive Patient Education  [employer].

## 2018-04-19 NOTE — Assessment & Plan Note (Signed)
Has high cholesterol and has been not been on medication recently-in the past he has tried Lipitor, Crestor and simvastatin and did nottolerate them He has not made the necessary lifestyle changes.  He is willing to try a different statin-we will try pitavastatin 1 mg daily and will titrate if tolerated If this is not effective we will refer to cardiology/lipid to consider PCSK9 inhibitors

## 2018-04-21 ENCOUNTER — Encounter: Payer: Self-pay | Admitting: Internal Medicine

## 2018-04-22 ENCOUNTER — Telehealth: Payer: Self-pay | Admitting: Internal Medicine

## 2018-04-22 NOTE — Telephone Encounter (Signed)
Copied from [place] [id number]. Topic: Quick Communication - See Telephone Encounter >> Apr 22, 2018  4:23 PM Vernona Rieger wrote: CRM for notification. See Telephone encounter for: 04/22/18.  Chris with [facility] called and said that Pitavastatin Calcium 1 MG TABS [[id number]]  is requiring a prior authorization. Please Advise.

## 2018-04-26 MED ORDER — PRAVASTATIN SODIUM 10 MG PO TABS: 10.0000 mg | ORAL_TABLET | Freq: Every day | ORAL | 5 refills | Status: DC

## 2018-04-26 NOTE — Telephone Encounter (Signed)
Please advise on alternative.  

## 2018-04-26 NOTE — Telephone Encounter (Signed)
Lets try pravastatin low dose -- sent to [facility]

## 2018-04-26 NOTE — Telephone Encounter (Signed)
LVM for pt to call back so I could update him on medication change.

## 2018-05-17 ENCOUNTER — Telehealth: Payer: Self-pay | Admitting: Internal Medicine

## 2018-05-17 NOTE — Telephone Encounter (Signed)
Copied from [place] [id number]. Topic: Quick Communication - See Telephone Encounter >> May 17, 2018 11:08 AM Conception Chancy, NT wrote: CRM for notification. See Telephone encounter for: 05/17/18.  Gerald Stabs is calling from [facility] and states that Pitivastatin 1 mg tablet is needing a prior authorization that was sent over on 04/19/18. I do not see this on patients file, please advise.   [id number] [place], [place] - [address] [place] [address] Phone: [phone] Fax: [phone]

## 2018-05-17 NOTE — Telephone Encounter (Signed)
Sent pt a my chart message. Tried to call him back in December to let him know that this medication was changed to an alternative medication.

## 2018-05-18 NOTE — Telephone Encounter (Signed)
LVM for pt to call back in regards.  

## 2018-06-24 ENCOUNTER — Encounter: Payer: Self-pay | Admitting: Internal Medicine

## 2018-07-12 ENCOUNTER — Other Ambulatory Visit: Payer: Self-pay | Admitting: Internal Medicine

## 2018-09-01 ENCOUNTER — Other Ambulatory Visit: Payer: Self-pay | Admitting: Internal Medicine

## 2018-10-06 ENCOUNTER — Encounter: Payer: Self-pay | Admitting: Internal Medicine

## 2018-10-06 NOTE — Telephone Encounter (Signed)
Latest insurance is updated, pt need PA  cb for pt is [phone]  Pt is out of meds, has been out for 2 days.  He is asking for samples

## 2018-10-06 NOTE — Telephone Encounter (Signed)
Re-routing to correct pool.

## 2018-10-17 MED ORDER — METFORMIN HCL ER 750 MG PO TB24: 1500.0000 mg | ORAL_TABLET | Freq: Every day | ORAL | 5 refills | Status: DC

## 2018-10-17 MED ORDER — SAXAGLIPTIN HCL 5 MG PO TABS: 5.0000 mg | ORAL_TABLET | Freq: Every day | ORAL | 5 refills | Status: DC

## 2018-10-17 NOTE — Telephone Encounter (Signed)
Same medications - just split them up - he will need to take 3 pills total in the morning with breakfast.  Sent to [facility]

## 2018-10-18 NOTE — Progress Notes (Signed)
Subjective:    Patient ID: Benjamin Chen, male    DOB: [birth year], 53 y.o.   MRN: [id number]  HPI The patient is here for follow up.  He is not exercising regularly - he has been walking some.   He has been eating better overall.  He thinks he does eat too much, but with the quarantine he has not been eating out as much.  Diabetes: He typically takes his medication daily, but there was an issue with his medication being approved by his insurance company and we had to change it.  He did go for a little over a week without medication.  He has picked up the metformin, but the onglyza was not ready and he is not sure why.  He is not compliant with a diabetic diet.  He does not monitor his sugars at home regularly, but checks it on occasion. He checks his feet daily and denies foot lesions.  He does want a referral to see podiatry.  He is up-to-date with an ophthalmology examination.   Hypertension: He did not take his medication last night.  He is not compliant with a low sodium diet.  He denies chest pain, palpitations, edema, shortness of breath and regular headaches.  He does not monitor his blood pressure at home.    Obesity: He is not exercising regularly.  He is trying to eat healthier and is trying to eat smaller portions.  He has not lost weight.    Gout: He is taking allopurinol daily as prescribed.  He has had some gout symptoms since his last appointment.  He was eating many "gout" foods, but since changing his diet the symptoms improved.    Hyperlipidemia: He is taking his medication daily. He is somewhat compliant with a low fat/cholesterol diet. He denies myalgias.      Medications and allergies reviewed with patient and updated if appropriate.  Patient Active Problem List   Diagnosis Date Noted  . Fatty liver 03/27/2018  . Cholelithiasis 03/27/2018  . Fatigue 10/13/2017  . Anal pain 04/13/2017  . Obesity, morbid ([place]) 01/12/2017  . Chest pain 10/07/2016  . Erectile  dysfunction 10/07/2016  . Gout 01/29/2016  . Arthralgia 01/24/2016  . Hyperlipidemia 01/24/2016  . Essential hypertension, benign 12/30/2015  . Diabetes ([place]) 12/30/2015  . OSA (obstructive sleep apnea) 12/30/2015  . Depression 12/30/2015  . Bilateral primary osteoarthritis of knee 12/30/2015  . Joint pain 12/30/2015    Current Outpatient Medications on File Prior to Visit  Medication Sig Dispense Refill  . allopurinol (ZYLOPRIM) 100 MG tablet TAKE 2 TABLETS BY MOUTH EVERY DAY 180 tablet 0  . amLODipine (NORVASC) 5 MG tablet Take 1 tablet (5 mg total) by mouth daily. Annual appt is due must see provider for future refills 90 tablet 1  . metFORMIN (GLUCOPHAGE XR) 750 MG 24 hr tablet Take 2 tablets (1,500 mg total) by mouth daily with breakfast. 60 tablet 5  . quinapril (ACCUPRIL) 20 MG tablet TAKE 1 TABLET BY MOUTH TWICE A DAY 180 tablet 1  . saxagliptin HCl (ONGLYZA) 5 MG TABS tablet Take 1 tablet (5 mg total) by mouth daily. 30 tablet 5  . spironolactone (ALDACTONE) 25 MG tablet Take 1 tablet (25 mg total) by mouth daily. 90 tablet 3   No current facility-administered medications on file prior to visit.     Past Medical History:  Diagnosis Date  . Allergy    seasonal  . Diabetes mellitus without complication ([place])   .  GERD (gastroesophageal reflux disease)    occasional heartburn/no meds  . Gout   . Hyperlipidemia   . Hypertension   . Sleep apnea    uses a c-pap    Past Surgical History:  Procedure Laterality Date  . KNEE ARTHROSCOPY Right    right - torn meniscus  . KNEE ARTHROSCOPY Left    left - torn meniscus    Social History   Socioeconomic History  . Marital status: Married    Spouse name: Not on file  . Number of children: Not on file  . Years of education: Not on file  . Highest education level: Not on file  Occupational History  . Not on file  Social Needs  . Financial resource strain: Not on file  . Food insecurity:    Worry: Not on file     Inability: Not on file  . Transportation needs:    Medical: Not on file    Non-medical: Not on file  Tobacco Use  . Smoking status: Never Smoker  . Smokeless tobacco: Never Used  Substance and Sexual Activity  . Alcohol use: Yes    Alcohol/week: 2.0 standard drinks    Types: 2 Standard drinks or equivalent per week  . Drug use: No  . Sexual activity: Not on file  Lifestyle  . Physical activity:    Days per week: Not on file    Minutes per session: Not on file  . Stress: Not on file  Relationships  . Social connections:    Talks on phone: Not on file    Gets together: Not on file    Attends religious service: Not on file    Active member of club or organization: Not on file    Attends meetings of clubs or organizations: Not on file    Relationship status: Not on file  Other Topics Concern  . Not on file  Social History Narrative  . Not on file    Family History  Problem Relation Age of Onset  . Hyperlipidemia Mother   . Hypertension Mother   . Mental illness Father   . Heart disease Maternal Aunt   . Diabetes Maternal Grandmother   . Heart disease Sister   . Hypertension Sister   . Thyroid disease Brother     Review of Systems  Constitutional: Negative for chills and fever.  Respiratory: Negative for cough, shortness of breath and wheezing.   Cardiovascular: Negative for chest pain, palpitations and leg swelling.  Musculoskeletal: Positive for arthralgias (knees) and joint swelling (knees).       Muscle sore to touch in legs at times  Neurological: Positive for light-headedness (occasional) and numbness (jolts of pain in feet). Negative for headaches.       Objective:   Vitals:   10/19/18 0759  BP: (!) 144/92  Pulse: 77  Resp: 16  Temp: 98.3 F (36.8 C)  SpO2: 98%   BP Readings from Last 3 Encounters:  10/19/18 (!) 144/92  04/19/18 (!) 134/92  03/27/18 (!) 131/95   Wt Readings from Last 3 Encounters:  10/19/18 290 lb 1.9 oz (131.6 kg)  04/19/18  293 lb (132.9 kg)  03/26/18 280 lb (127 kg)   Body mass index is 39.35 kg/m.   Physical Exam    Constitutional: Appears well-developed and well-nourished. No distress.  HENT:  Head: Normocephalic and atraumatic.  Neck: Neck supple. No tracheal deviation present. No thyromegaly present.  No cervical lymphadenopathy Cardiovascular: Normal rate, regular rhythm and normal heart  sounds.   No murmur heard. No carotid bruit .  No edema Pulmonary/Chest: Effort normal and breath sounds normal. No respiratory distress. No has no wheezes. No rales.  Skin: Skin is warm and dry. Not diaphoretic.  Psychiatric: Normal mood and affect. Behavior is normal.      Assessment & Plan:    See Problem List for Assessment and Plan of chronic medical problems.

## 2018-10-18 NOTE — Patient Instructions (Addendum)
  Tests ordered today. Your results will be released to [place] (or called to you) after review, usually within 72hours after test completion. If any changes need to be made, you will be notified at that same time.   Medications reviewed and updated.  Changes include :   none  Your prescription(s) have been submitted to your pharmacy. Please take as directed and contact our office if you believe you are having problem(s) with the medication(s).  A referral was ordered for podiatry  Please followup in 6 months

## 2018-10-19 ENCOUNTER — Encounter: Payer: Self-pay | Admitting: Internal Medicine

## 2018-10-19 ENCOUNTER — Ambulatory Visit (INDEPENDENT_AMBULATORY_CARE_PROVIDER_SITE_OTHER): Payer: Commercial Managed Care - PPO | Admitting: Internal Medicine

## 2018-10-19 ENCOUNTER — Other Ambulatory Visit: Payer: Self-pay

## 2018-10-19 ENCOUNTER — Other Ambulatory Visit (INDEPENDENT_AMBULATORY_CARE_PROVIDER_SITE_OTHER): Payer: Commercial Managed Care - PPO

## 2018-10-19 VITALS — BP 144/92 | HR 77 | Temp 98.3°F | Resp 16 | Ht 72.0 in | Wt 290.1 lb

## 2018-10-19 DIAGNOSIS — E119 Type 2 diabetes mellitus without complications: Secondary | ICD-10-CM

## 2018-10-19 DIAGNOSIS — E7849 Other hyperlipidemia: Secondary | ICD-10-CM

## 2018-10-19 DIAGNOSIS — M109 Gout, unspecified: Secondary | ICD-10-CM

## 2018-10-19 DIAGNOSIS — I10 Essential (primary) hypertension: Secondary | ICD-10-CM

## 2018-10-19 LAB — LIPID PANEL
Cholesterol: 180 mg/dL (ref 0–200)
HDL: 25.8 mg/dL — ABNORMAL LOW (ref >39.00)
LDL Cholesterol: 121 mg/dL — ABNORMAL HIGH (ref 0–99)
NonHDL: 153.78
Total CHOL/HDL Ratio: 7
Triglycerides: 165 mg/dL — ABNORMAL HIGH (ref 0.0–149.0)
VLDL: 33 mg/dL (ref 0.0–40.0)

## 2018-10-19 LAB — HEMOGLOBIN A1C: Hgb A1c MFr Bld: 8.1 % — ABNORMAL HIGH (ref 4.6–6.5)

## 2018-10-19 LAB — COMPREHENSIVE METABOLIC PANEL
ALT: 38 U/L (ref 0–53)
AST: 20 U/L (ref 0–37)
Albumin: 4.1 g/dL (ref 3.5–5.2)
Alkaline Phosphatase: 79 U/L (ref 39–117)
BUN: 15 mg/dL (ref 6–23)
CO2: 25 mEq/L (ref 19–32)
Calcium: 9.4 mg/dL (ref 8.4–10.5)
Chloride: 101 mEq/L (ref 96–112)
Creatinine, Ser: 1.19 mg/dL (ref 0.40–1.50)
GFR: 77.83 mL/min (ref >60.00)
Glucose, Bld: 195 mg/dL — ABNORMAL HIGH (ref 70–99)
Potassium: 4.4 mEq/L (ref 3.5–5.1)
Sodium: 135 mEq/L (ref 135–145)
Total Bilirubin: 0.8 mg/dL (ref 0.2–1.2)
Total Protein: 6.8 g/dL (ref 6.0–8.3)

## 2018-10-19 LAB — URIC ACID: Uric Acid, Serum: 4.9 mg/dL (ref 4.0–7.8)

## 2018-10-19 MED ORDER — PRAVASTATIN SODIUM 10 MG PO TABS: 10.0000 mg | ORAL_TABLET | Freq: Every day | ORAL | 5 refills | Status: DC

## 2018-10-19 NOTE — Assessment & Plan Note (Signed)
BP elevated - did not take his medication last night Will not change today given he has not taken his medication cmp

## 2018-10-19 NOTE — Assessment & Plan Note (Addendum)
With uncontrolled blood pressure and diabetes Stressed weight loss Stressed regular exercise Stressed decreased portions, low carb / sugar diet F/u in 3 months

## 2018-10-19 NOTE — Assessment & Plan Note (Signed)
Check a1c Low sugar / carb diet Stressed regular exercise Stressed weight loss  If onglyza is not covered will change to [place] If a1c is elevated will add jardiance

## 2018-10-19 NOTE — Assessment & Plan Note (Signed)
Check uric acid Some gout symptoms Continue allopurinol dose

## 2018-10-19 NOTE — Assessment & Plan Note (Signed)
Check lipid panel  Continue daily statin Regular exercise and healthy diet encouraged  

## 2018-10-20 ENCOUNTER — Telehealth: Payer: Self-pay | Admitting: Internal Medicine

## 2018-10-20 NOTE — Telephone Encounter (Signed)
Copied from [place] [id number]. Topic: General - Other >> Oct 20, 2018  2:57 PM Lennox Solders wrote: Reason for CRM: [facility] is not covered on pt insurance. Please advise. Linton Rump does not have which med is covered

## 2018-10-21 MED ORDER — SITAGLIPTIN PHOSPHATE 100 MG PO TABS: 100.0000 mg | ORAL_TABLET | Freq: Every day | ORAL | 1 refills | Status: DC

## 2018-10-21 NOTE — Telephone Encounter (Signed)
Please advise on alternative.  

## 2018-10-21 NOTE — Telephone Encounter (Signed)
januvia sent.

## 2018-10-25 ENCOUNTER — Other Ambulatory Visit: Payer: Self-pay | Admitting: Internal Medicine

## 2018-10-25 MED ORDER — DAPAGLIFLOZIN PROPANEDIOL 5 MG PO TABS: 5.0000 mg | ORAL_TABLET | Freq: Every day | ORAL | 5 refills | Status: DC

## 2018-11-07 MED ORDER — EMPAGLIFLOZIN 10 MG PO TABS: 10.0000 mg | ORAL_TABLET | Freq: Every day | ORAL | 5 refills | Status: DC

## 2018-11-08 ENCOUNTER — Other Ambulatory Visit: Payer: Self-pay

## 2018-11-08 MED ORDER — EMPAGLIFLOZIN 10 MG PO TABS: 10.0000 mg | ORAL_TABLET | Freq: Every day | ORAL | 5 refills | Status: DC

## 2018-12-01 ENCOUNTER — Other Ambulatory Visit: Payer: Self-pay | Admitting: Internal Medicine

## 2018-12-06 ENCOUNTER — Encounter: Payer: Self-pay | Admitting: Internal Medicine

## 2018-12-12 ENCOUNTER — Other Ambulatory Visit: Payer: Self-pay | Admitting: Internal Medicine

## 2019-01-04 ENCOUNTER — Other Ambulatory Visit: Payer: Self-pay | Admitting: Internal Medicine

## 2019-01-17 NOTE — Progress Notes (Signed)
Subjective:    Patient ID: Benjamin Chen, male    DOB: October 28, 1965, 53 y.o.   MRN: [id number]  HPI The patient is here for follow up.  He is not exercising regularly.  He has been eating less and has lost some weight.  Hypertension: He is taking his medication daily. He is compliant with a low sodium diet.  He denies palpitations, edema, shortness of breath and regular headaches.      Diabetes: He is taking his medication daily as prescribed. He is compliant with a diabetic diet.  He checks his feet daily and denies foot lesions. He is up-to-date with an ophthalmology examination.   Hyperlipidemia: He is taking his medication daily. He is compliant with a low fat/cholesterol diet. He denies myalgias.   Gout: he is taking allopurinol daily.  He denies any gout symptoms.   OSA:  He is not wearing cpap nightly.  He knows he should be wearing it.   Morbid obesity:  He is not exercising regularly.  He has decreased how much he is eating.  He has lost weight and did not realize how much until today.   He is feeling a little depressed - he has this intermittently.  It is mild.   Medications and allergies reviewed with patient and updated if appropriate.  Patient Active Problem List   Diagnosis Date Noted  . Fatty liver 03/27/2018  . Cholelithiasis 03/27/2018  . Fatigue 10/13/2017  . Obesity, morbid ([place]) 01/12/2017  . Chest pain 10/07/2016  . Erectile dysfunction 10/07/2016  . Gout 01/29/2016  . Arthralgia 01/24/2016  . Hyperlipidemia 01/24/2016  . Essential hypertension, benign 12/30/2015  . Diabetes ([place]) 12/30/2015  . OSA (obstructive sleep apnea) 12/30/2015  . Depression 12/30/2015  . Bilateral primary osteoarthritis of knee 12/30/2015  . Joint pain 12/30/2015    Current Outpatient Medications on File Prior to Visit  Medication Sig Dispense Refill  . allopurinol (ZYLOPRIM) 100 MG tablet TAKE 2 TABLETS BY MOUTH EVERY DAY 180 tablet 1  . amLODipine (NORVASC) 5 MG tablet  TAKE 1 TABLET (5 MG TOTAL) BY MOUTH DAILY. MUST KEEP SCHEDULED APPT FOR FUTURE REFILLS 90 tablet 0  . empagliflozin (JARDIANCE) 10 MG TABS tablet Take 10 mg by mouth daily. 30 tablet 5  . metFORMIN (GLUCOPHAGE XR) 750 MG 24 hr tablet Take 2 tablets (1,500 mg total) by mouth daily with breakfast. 60 tablet 5  . pravastatin (PRAVACHOL) 10 MG tablet Take 1 tablet (10 mg total) by mouth daily. 30 tablet 5  . quinapril (ACCUPRIL) 20 MG tablet TAKE 1 TABLET BY MOUTH TWICE A DAY 180 tablet 1  . sitaGLIPtin (JANUVIA) 100 MG tablet Take 1 tablet (100 mg total) by mouth daily. 90 tablet 1  . spironolactone (ALDACTONE) 25 MG tablet Take 1 tablet (25 mg total) by mouth daily. 90 tablet 3   No current facility-administered medications on file prior to visit.     Past Medical History:  Diagnosis Date  . Allergy    seasonal  . Diabetes mellitus without complication ([place])   . GERD (gastroesophageal reflux disease)    occasional heartburn/no meds  . Gout   . Hyperlipidemia   . Hypertension   . Sleep apnea    uses a c-pap    Past Surgical History:  Procedure Laterality Date  . KNEE ARTHROSCOPY Right    right - torn meniscus  . KNEE ARTHROSCOPY Left    left - torn meniscus    Social History   Socioeconomic  History  . Marital status: Married    Spouse name: Not on file  . Number of children: Not on file  . Years of education: Not on file  . Highest education level: Not on file  Occupational History  . Not on file  Social Needs  . Financial resource strain: Not on file  . Food insecurity    Worry: Not on file    Inability: Not on file  . Transportation needs    Medical: Not on file    Non-medical: Not on file  Tobacco Use  . Smoking status: Never Smoker  . Smokeless tobacco: Never Used  Substance and Sexual Activity  . Alcohol use: Yes    Alcohol/week: 2.0 standard drinks    Types: 2 Standard drinks or equivalent per week  . Drug use: No  . Sexual activity: Not on file   Lifestyle  . Physical activity    Days per week: Not on file    Minutes per session: Not on file  . Stress: Not on file  Relationships  . Social Herbalist on phone: Not on file    Gets together: Not on file    Attends religious service: Not on file    Active member of club or organization: Not on file    Attends meetings of clubs or organizations: Not on file    Relationship status: Not on file  Other Topics Concern  . Not on file  Social History Narrative  . Not on file    Family History  Problem Relation Age of Onset  . Hyperlipidemia Mother   . Hypertension Mother   . Mental illness Father   . Heart disease Maternal Aunt   . Diabetes Maternal Grandmother   . Heart disease Sister   . Hypertension Sister   . Thyroid disease Brother     Review of Systems  Constitutional: Positive for fatigue. Negative for chills and fever.  Respiratory: Negative for cough, shortness of breath and wheezing.   Cardiovascular: Positive for chest pain (occ left sided chest pain, ? changes in position - lasts only a few seconds). Negative for palpitations and leg swelling.  Endocrine: Positive for polydipsia. Negative for polyuria.  Musculoskeletal: Positive for arthralgias.  Neurological: Negative for light-headedness and headaches.  Psychiatric/Behavioral: Positive for dysphoric mood. The patient is not nervous/anxious.        Objective:   Vitals:   01/18/19 0756  BP: 124/84  Pulse: (!) 103  Resp: 16  Temp: 97.9 F (36.6 C)  SpO2: 96%   BP Readings from Last 3 Encounters:  01/18/19 124/84  10/19/18 (!) 144/92  04/19/18 (!) 134/92   Wt Readings from Last 3 Encounters:  01/18/19 278 lb (126.1 kg)  10/19/18 290 lb 1.9 oz (131.6 kg)  04/19/18 293 lb (132.9 kg)   Body mass index is 37.7 kg/m.   Physical Exam    Constitutional: Appears well-developed and well-nourished. No distress.  HENT:  Head: Normocephalic and atraumatic.  Neck: Neck supple. No tracheal  deviation present. No thyromegaly present.  No cervical lymphadenopathy Cardiovascular: Normal rate, regular rhythm and normal heart sounds.   No murmur heard. No carotid bruit .  No edema Pulmonary/Chest: Effort normal and breath sounds normal. No respiratory distress. No has no wheezes. No rales.  Skin: Skin is warm and dry. Not diaphoretic.  Psychiatric: Normal mood and affect. Behavior is normal.      Assessment & Plan:    See Problem List for Assessment  and Plan of chronic medical problems.

## 2019-01-17 NOTE — Patient Instructions (Addendum)
  Tests ordered today. Your results will be released to MyChart (or called to you) after review.  If any changes need to be made, you will be notified at that same time.  Flu immunization administered today.    Medications reviewed and updated.  Changes include :  none      Please followup in 6 months   

## 2019-01-18 ENCOUNTER — Ambulatory Visit (INDEPENDENT_AMBULATORY_CARE_PROVIDER_SITE_OTHER): Payer: Commercial Managed Care - PPO | Admitting: Internal Medicine

## 2019-01-18 ENCOUNTER — Encounter: Payer: Self-pay | Admitting: Internal Medicine

## 2019-01-18 ENCOUNTER — Other Ambulatory Visit (INDEPENDENT_AMBULATORY_CARE_PROVIDER_SITE_OTHER): Payer: Commercial Managed Care - PPO

## 2019-01-18 ENCOUNTER — Other Ambulatory Visit: Payer: Self-pay

## 2019-01-18 VITALS — BP 124/84 | HR 103 | Temp 97.9°F | Resp 16 | Ht 72.0 in | Wt 278.0 lb

## 2019-01-18 DIAGNOSIS — I10 Essential (primary) hypertension: Secondary | ICD-10-CM

## 2019-01-18 DIAGNOSIS — E119 Type 2 diabetes mellitus without complications: Secondary | ICD-10-CM

## 2019-01-18 DIAGNOSIS — M109 Gout, unspecified: Secondary | ICD-10-CM

## 2019-01-18 DIAGNOSIS — E7849 Other hyperlipidemia: Secondary | ICD-10-CM | POA: Diagnosis not present

## 2019-01-18 DIAGNOSIS — Z23 Encounter for immunization: Secondary | ICD-10-CM

## 2019-01-18 DIAGNOSIS — R7989 Other specified abnormal findings of blood chemistry: Secondary | ICD-10-CM | POA: Insufficient documentation

## 2019-01-18 DIAGNOSIS — G4733 Obstructive sleep apnea (adult) (pediatric): Secondary | ICD-10-CM

## 2019-01-18 DIAGNOSIS — F3289 Other specified depressive episodes: Secondary | ICD-10-CM

## 2019-01-18 LAB — LIPID PANEL
Cholesterol: 162 mg/dL (ref 0–200)
HDL: 28.5 mg/dL — ABNORMAL LOW (ref >39.00)
LDL Cholesterol: 104 mg/dL — ABNORMAL HIGH (ref 0–99)
NonHDL: 133.9
Total CHOL/HDL Ratio: 6
Triglycerides: 149 mg/dL (ref 0.0–149.0)
VLDL: 29.8 mg/dL (ref 0.0–40.0)

## 2019-01-18 LAB — BASIC METABOLIC PANEL
BUN: 18 mg/dL (ref 6–23)
CO2: 27 mEq/L (ref 19–32)
Calcium: 9.6 mg/dL (ref 8.4–10.5)
Chloride: 102 mEq/L (ref 96–112)
Creatinine, Ser: 1.37 mg/dL (ref 0.40–1.50)
GFR: 66.09 mL/min (ref >60.00)
Glucose, Bld: 125 mg/dL — ABNORMAL HIGH (ref 70–99)
Potassium: 4.6 mEq/L (ref 3.5–5.1)
Sodium: 136 mEq/L (ref 135–145)

## 2019-01-18 LAB — HEMOGLOBIN A1C: Hgb A1c MFr Bld: 7.2 % — ABNORMAL HIGH (ref 4.6–6.5)

## 2019-01-18 NOTE — Assessment & Plan Note (Signed)
Mild Will consider wellbutrin

## 2019-01-18 NOTE — Assessment & Plan Note (Signed)
Has fatigue, diabetes, depression Testosterone low last year Will recheck testosterone level

## 2019-01-18 NOTE — Assessment & Plan Note (Signed)
He has lost weight by eating less Check a1c Will titrate meds if needed Encouraged continuing weight loss efforts

## 2019-01-18 NOTE — Assessment & Plan Note (Signed)
BP well controlled Current regimen effective and well tolerated Continue current medications at current doses cmp  

## 2019-01-18 NOTE — Assessment & Plan Note (Signed)
Uric acid level has been well controlled Denies gout symptoms Continue allopurinol daily

## 2019-01-18 NOTE — Addendum Note (Signed)
Addended by: Delice Bison E on: 01/18/2019 12:55 PM   Modules accepted: Orders

## 2019-01-18 NOTE — Assessment & Plan Note (Signed)
He has not been using his cpap and has not used it in a while Will clean machine and start using it Stressed using machine daily

## 2019-01-18 NOTE — Assessment & Plan Note (Signed)
Continue statin. 

## 2019-01-18 NOTE — Assessment & Plan Note (Signed)
With diabetes, htn Has been eating less and has lost weight Not currently exercising but will start Stressed importance of weight loss FU in 6 months

## 2019-01-21 ENCOUNTER — Encounter: Payer: Self-pay | Admitting: Internal Medicine

## 2019-01-21 LAB — TESTOSTERONE, FREE & TOTAL
Free Testosterone: 69.1 pg/mL (ref 35.0–155.0)
Testosterone, Total, LC-MS-MS: 307 ng/dL (ref 250–1100)

## 2019-01-23 ENCOUNTER — Encounter: Payer: Self-pay | Admitting: Internal Medicine

## 2019-02-02 ENCOUNTER — Encounter: Payer: Self-pay | Admitting: Internal Medicine

## 2019-02-02 DIAGNOSIS — J029 Acute pharyngitis, unspecified: Secondary | ICD-10-CM

## 2019-02-03 ENCOUNTER — Other Ambulatory Visit: Payer: Self-pay

## 2019-02-03 DIAGNOSIS — Z20822 Contact with and (suspected) exposure to covid-19: Secondary | ICD-10-CM

## 2019-02-04 LAB — NOVEL CORONAVIRUS, NAA: SARS-CoV-2, NAA: NOT DETECTED

## 2019-02-05 ENCOUNTER — Encounter: Payer: Self-pay | Admitting: Internal Medicine

## 2019-02-26 ENCOUNTER — Other Ambulatory Visit: Payer: Self-pay | Admitting: Internal Medicine

## 2019-04-22 ENCOUNTER — Other Ambulatory Visit: Payer: Self-pay | Admitting: Internal Medicine

## 2019-04-23 ENCOUNTER — Other Ambulatory Visit: Payer: Self-pay | Admitting: Internal Medicine

## 2019-04-27 ENCOUNTER — Other Ambulatory Visit: Payer: Self-pay | Admitting: Internal Medicine

## 2019-05-13 ENCOUNTER — Other Ambulatory Visit: Payer: Self-pay | Admitting: Internal Medicine

## 2019-05-17 ENCOUNTER — Other Ambulatory Visit: Payer: Self-pay | Admitting: Internal Medicine

## 2019-06-26 ENCOUNTER — Encounter: Payer: Self-pay | Admitting: Internal Medicine

## 2019-07-02 NOTE — Progress Notes (Signed)
Virtual Visit via Video Note  I connected with Benjamin Chen on 07/03/19 at  3:00 PM EST by a video enabled telemedicine application and verified that I am speaking with the correct person using two identifiers.   I discussed the limitations of evaluation and management by telemedicine and the availability of in person appointments. The patient expressed understanding and agreed to proceed.  Present for the visit:  Myself, Dr Billey Gosling, Duffy Rhody.  The patient is currently at home and I am in the office.    No referring provider.    History of Present Illness: This is an acute visit for fatigue and SOB.  He can be sitting still and will feel SOB.  His breath will get choppy - it is not continuous.  His breathing feels labored.  He notices it mostly at night, but it can happen at any time during the day.  He feels a little fluttering in his left chest and has some tightness in the left chest.  His head will hurt sometimes on the left side after he experiences the fluttering in his chest.    Every now and then he will start to eat something and will have severe GERD and will have to regurgitate to be able to eat.  This typically happens only when he is starting to eat or has had one bite.  He often knows this is going to happen before he even starts eating.  When he goes to eat the food gets stuck and has to come back up. Was occurring once a week, but has improved and he has not felt this in a few weeks.   He has complained of no energy and fatigue in the past, but states this is different.  He feels completely wiped out.  He does have sleep apnea and he has not been using his CPAP.  He is taking his medications and wonders if he really needs to take the one for gout.  He wonders if some of his medications are causing some of the symptoms.  He has not been checking his blood pressure or sugar at home.  He is not exercising regularly.   Review of Systems  Constitutional: Positive for  malaise/fatigue. Negative for fever.  Respiratory: Negative for cough and wheezing.   Cardiovascular: Positive for chest pain (sometimes, tightness) and palpitations. Negative for leg swelling.  Neurological: Negative for dizziness and headaches.     Social History   Socioeconomic History  . Marital status: Married    Spouse name: Not on file  . Number of children: Not on file  . Years of education: Not on file  . Highest education level: Not on file  Occupational History  . Not on file  Tobacco Use  . Smoking status: Never Smoker  . Smokeless tobacco: Never Used  Substance and Sexual Activity  . Alcohol use: Yes    Alcohol/week: 2.0 standard drinks    Types: 2 Standard drinks or equivalent per week  . Drug use: No  . Sexual activity: Not on file  Other Topics Concern  . Not on file  Social History Narrative  . Not on file   Social Determinants of Health   Financial Resource Strain:   . Difficulty of Paying Living Expenses: Not on file  Food Insecurity:   . Worried About Charity fundraiser in the Last Year: Not on file  . Ran Out of Food in the Last Year: Not on file  Transportation Needs:   .  Lack of Transportation (Medical): Not on file  . Lack of Transportation (Non-Medical): Not on file  Physical Activity:   . Days of Exercise per Week: Not on file  . Minutes of Exercise per Session: Not on file  Stress:   . Feeling of Stress : Not on file  Social Connections:   . Frequency of Communication with Friends and Family: Not on file  . Frequency of Social Gatherings with Friends and Family: Not on file  . Attends Religious Services: Not on file  . Active Member of Clubs or Organizations: Not on file  . Attends Archivist Meetings: Not on file  . Marital Status: Not on file     Observations/Objective: Appears well in NAD Breathing normally and speaking in full sentences Skin appears warm and dry   Assessment and Plan:  See Problem List for  Assessment and Plan of chronic medical problems.   Follow Up Instructions:    I discussed the assessment and treatment plan with the patient. The patient was provided an opportunity to ask questions and all were answered. The patient agreed with the plan and demonstrated an understanding of the instructions.   The patient was advised to call back or seek an in-person evaluation if the symptoms worsen or if the condition fails to improve as anticipated.    Binnie Rail, MD

## 2019-07-03 ENCOUNTER — Ambulatory Visit (INDEPENDENT_AMBULATORY_CARE_PROVIDER_SITE_OTHER): Payer: Commercial Managed Care - PPO | Admitting: Internal Medicine

## 2019-07-03 ENCOUNTER — Encounter: Payer: Self-pay | Admitting: Internal Medicine

## 2019-07-03 DIAGNOSIS — R5383 Other fatigue: Secondary | ICD-10-CM

## 2019-07-03 DIAGNOSIS — R002 Palpitations: Secondary | ICD-10-CM

## 2019-07-03 DIAGNOSIS — G4733 Obstructive sleep apnea (adult) (pediatric): Secondary | ICD-10-CM

## 2019-07-03 DIAGNOSIS — R0602 Shortness of breath: Secondary | ICD-10-CM | POA: Diagnosis not present

## 2019-07-03 DIAGNOSIS — K219 Gastro-esophageal reflux disease without esophagitis: Secondary | ICD-10-CM | POA: Insufficient documentation

## 2019-07-03 DIAGNOSIS — R0789 Other chest pain: Secondary | ICD-10-CM | POA: Diagnosis not present

## 2019-07-03 NOTE — Assessment & Plan Note (Signed)
Chronic, but states it is much worse now He has not been using his CPAP and advised this may be contributing to some of his fatigue He does have an appointment next month and we will do complete blood work Possibly related to shortness of breath, chest tightness, palpitations-referred to pulmonary and cardiology, whom he has seen in the past for further evaluation

## 2019-07-03 NOTE — Assessment & Plan Note (Signed)
Chronic Not using his CPAP Stressed that he needs to be using his CPAP on a nightly basis.  Discussed this may be contributing to some of his symptoms Referred back to pulmonary

## 2019-07-03 NOTE — Assessment & Plan Note (Signed)
His difficulty swallowing/reflux/regurgitation sounds like atypical GERD It has improved so we will hold off on starting medication at this time Will discuss further at his upcoming appointment next month in person

## 2019-07-03 NOTE — Assessment & Plan Note (Signed)
Denies true shortness of breath with exertion, but more of a difficulty taking a deep breath or shopping breathing as he states at rest ?  Related to his sleep apnea, which he is currently not using his CPAP Stressed using his CPAP on a nightly basis We will refer to cardiology and pulmonary

## 2019-07-03 NOTE — Assessment & Plan Note (Signed)
Difficult to say what the cause is Will refer to pulmonary and cardiology for full work-up He is due to see me next month and I will do complete blood work at that time

## 2019-07-03 NOTE — Assessment & Plan Note (Signed)
Intermittent palpitations Will refer to cardiology for further evaluation Has an appointment next month and I will do blood work at that time

## 2019-07-12 ENCOUNTER — Other Ambulatory Visit: Payer: Self-pay

## 2019-07-12 ENCOUNTER — Encounter: Payer: Self-pay | Admitting: Cardiovascular Disease

## 2019-07-12 ENCOUNTER — Telehealth: Payer: Self-pay | Admitting: Radiology

## 2019-07-12 ENCOUNTER — Ambulatory Visit (INDEPENDENT_AMBULATORY_CARE_PROVIDER_SITE_OTHER): Payer: Commercial Managed Care - PPO | Admitting: Cardiovascular Disease

## 2019-07-12 DIAGNOSIS — E782 Mixed hyperlipidemia: Secondary | ICD-10-CM | POA: Diagnosis not present

## 2019-07-12 DIAGNOSIS — R002 Palpitations: Secondary | ICD-10-CM

## 2019-07-12 DIAGNOSIS — R0602 Shortness of breath: Secondary | ICD-10-CM

## 2019-07-12 DIAGNOSIS — I10 Essential (primary) hypertension: Secondary | ICD-10-CM

## 2019-07-12 DIAGNOSIS — G4733 Obstructive sleep apnea (adult) (pediatric): Secondary | ICD-10-CM

## 2019-07-12 NOTE — Patient Instructions (Signed)
Medication Instructions:  Your physician recommends that you continue on your current medications as directed. Please refer to the Current Medication list given to you today.  If you need a refill on your cardiac medications before your next appointment, please call your pharmacy.   Lab work: NONE  Testing/Procedures: Your physician has requested that you have an echocardiogram. Echocardiography is a painless test that uses sound waves to create images of your heart. It provides your doctor with information about the size and shape of your heart and how well your heart's chambers and valves are working. This procedure takes approximately one hour. There are no restrictions for this procedure. [place] [address]  AND  2 Week Zio Patch  Follow-Up: At [facility], you and your health needs are our priority.  As part of our continuing mission to provide you with exceptional heart care, we have created designated Provider Care Teams.  These Care Teams include your primary Cardiologist (physician) and Advanced Practice Providers (APPs -  Physician Assistants and Nurse Practitioners) who all work together to provide you with the care you need, when you need it. You may see Dr. Gwenlyn Found or one of the following Advanced Practice Providers on your designated Care Team:    Kerin Ransom, PA-C  [place], [place]  Coletta Memos, [place]  Your physician wants you to follow-up in: 3 months with Dr. Gwenlyn Found  Any Other Special Instructions Will Be Listed Below (If Applicable).  ZIO XT- Long Term Monitor Instructions   Your physician has requested you wear your ZIO patch monitor 14 days.   This is a single patch monitor.  [employer] supplies one patch monitor per enrollment.  Additional stickers are not available.   Please do not apply patch if you will be having a Nuclear Stress Test, Echocardiogram, Cardiac CT, MRI, or Chest Xray during the time frame you would be wearing the monitor. The  patch cannot be worn during these tests.  You cannot remove and re-apply the ZIO XT patch monitor.   Your ZIO patch monitor will be sent USPS Priority mail from [facility] directly to your home address. The monitor may also be mailed to a PO BOX if home delivery is not available.   It may take 3-5 days to receive your monitor after you have been enrolled.   Once you have received you monitor, please review enclosed instructions.  Your monitor has already been registered assigning a specific monitor serial # to you.   Applying the monitor   Shave hair from upper left chest.   Hold abrader disc by orange tab.  Rub abrader in 40 strokes over left upper chest as indicated in your monitor instructions.   Clean area with 4 enclosed alcohol pads .  Use all pads to assure are is cleaned thoroughly.  Let dry.   Apply patch as indicated in monitor instructions.  Patch will be place under collarbone on left side of chest with arrow pointing upward.   Rub patch adhesive wings for 2 minutes.Remove white label marked "1".  Remove white label marked "2".  Rub patch adhesive wings for 2 additional minutes.   While looking in a mirror, press and release button in center of patch.  A small green light will flash 3-4 times .  This will be your only indicator the monitor has been turned on.     Do not shower for the first 24 hours.  You may shower after the first 24 hours.  Press button if you feel a symptom. You will hear a small click.  Record Date, Time and Symptom in the Patient Log Book.   When you are ready to remove patch, follow instructions on last 2 pages of Patient Log Book.  Stick patch monitor onto last page of Patient Log Book.   Place Patient Log Book in [place] box.  Use locking tab on box and tape box closed securely.  The [employer] has [employer] on it.  Please place in mailbox as soon as possible.  Your physician should have your test results approximately 7 days  after the monitor has been mailed back to [facility].   Call [place] at [phone] if you have questions regarding your ZIO XT patch monitor.  Call them immediately if you see an orange light blinking on your monitor.   If your monitor falls off in less than 4 days contact our Monitor department at [phone].  If your monitor becomes loose or falls off after 4 days call [employer] at [phone] for suggestions on securing your monitor.

## 2019-07-12 NOTE — Telephone Encounter (Signed)
Enrolled patient for a 14 day Zio monitor to be mailed to patients home.  

## 2019-07-12 NOTE — Assessment & Plan Note (Signed)
History of palpitations for last several years not occurring on a daily basis.  We will get a 2-week Zio patch to further evaluate

## 2019-07-12 NOTE — Progress Notes (Signed)
07/12/2019 [name]   1965-08-09  [id number]  Primary Physician Quay Burow, Claudina Lick, MD Primary Cardiologist: Lorretta Harp MD Lupe Carney, [place]  HPI:  Benjamin Chen is a 54 y.o.  moderately overweight married African-American male father of 3 children who works as a Medical illustrator. He is referred by Dr. Quay Burow for cardiovascular evaluation because of atypical chest pain.  I last saw him in the office 10/20/2016.  His risk factors include treated hypertension and diabetes. He's never had a heart attack or stroke. There is no family history. He probably did have a negative radial heart cath at [facility] in [place] approximately 8-9 years ago.  When I saw him 2 years ago he was still having atypical chest pain on a daily basis.  A Myoview stress test performed 10/28/2016 was normal.  Since I saw him 2-1/2 years ago he is noticed increasing dyspnea on exertion over the last several months with daily pill for palpitations.  Current Meds  Medication Sig  . allopurinol (ZYLOPRIM) 100 MG tablet TAKE 2 TABLETS BY MOUTH EVERY DAY  . amLODipine (NORVASC) 5 MG tablet Take 1 tablet (5 mg total) by mouth daily.  . empagliflozin (JARDIANCE) 10 MG TABS tablet Take 10 mg by mouth daily. Follow-up appt due in March must see provider for future refills  . JANUVIA 100 MG tablet TAKE 1 TABLET BY MOUTH EVERY DAY  . metFORMIN (GLUCOPHAGE-XR) 750 MG 24 hr tablet TAKE 2 TABLETS (1,500 MG TOTAL) BY MOUTH DAILY WITH BREAKFAST.  Marland Kitchen pravastatin (PRAVACHOL) 10 MG tablet Take 1 tablet (10 mg total) by mouth daily.  . quinapril (ACCUPRIL) 20 MG tablet TAKE 1 TABLET BY MOUTH TWICE A DAY  . spironolactone (ALDACTONE) 25 MG tablet TAKE 1 TABLET BY MOUTH EVERY DAY     Allergies  Allergen Reactions  . Penicillins Swelling    Pt  unsure about how much or where the swelling was.    Social History   Socioeconomic History  . Marital status: Married    Spouse name: Not on file  . Number  of children: Not on file  . Years of education: Not on file  . Highest education level: Not on file  Occupational History  . Not on file  Tobacco Use  . Smoking status: Never Smoker  . Smokeless tobacco: Never Used  Substance and Sexual Activity  . Alcohol use: Yes    Alcohol/week: 2.0 standard drinks    Types: 2 Standard drinks or equivalent per week  . Drug use: No  . Sexual activity: Not on file  Other Topics Concern  . Not on file  Social History Narrative  . Not on file   Social Determinants of Health   Financial Resource Strain:   . Difficulty of Paying Living Expenses: Not on file  Food Insecurity:   . Worried About Charity fundraiser in the Last Year: Not on file  . Ran Out of Food in the Last Year: Not on file  Transportation Needs:   . Lack of Transportation (Medical): Not on file  . Lack of Transportation (Non-Medical): Not on file  Physical Activity:   . Days of Exercise per Week: Not on file  . Minutes of Exercise per Session: Not on file  Stress:   . Feeling of Stress : Not on file  Social Connections:   . Frequency of Communication with Friends and Family: Not on file  . Frequency of Social Gatherings with Friends  and Family: Not on file  . Attends Religious Services: Not on file  . Active Member of Clubs or Organizations: Not on file  . Attends Archivist Meetings: Not on file  . Marital Status: Not on file  Intimate Partner Violence:   . Fear of Current or Ex-Partner: Not on file  . Emotionally Abused: Not on file  . Physically Abused: Not on file  . Sexually Abused: Not on file     Review of Systems: General: negative for chills, fever, night sweats or weight changes.  Cardiovascular: negative for chest pain, dyspnea on exertion, edema, orthopnea, palpitations, paroxysmal nocturnal dyspnea or shortness of breath Dermatological: negative for rash Respiratory: negative for cough or wheezing Urologic: negative for hematuria Abdominal:  negative for nausea, vomiting, diarrhea, bright red blood per rectum, melena, or hematemesis Neurologic: negative for visual changes, syncope, or dizziness All other systems reviewed and are otherwise negative except as noted above.    Blood pressure 115/78, pulse 83, temperature 98.2 F (36.8 C), height 6' (1.829 m), weight 272 lb 6.4 oz (123.6 kg), SpO2 98 %.  General appearance: alert and no distress Neck: no adenopathy, no carotid bruit, no JVD, supple, symmetrical, trachea midline and thyroid not enlarged, symmetric, no tenderness/mass/nodules Lungs: clear to auscultation bilaterally Heart: regular rate and rhythm, S1, S2 normal, no murmur, click, rub or gallop Extremities: extremities normal, atraumatic, no cyanosis or edema Pulses: 2+ and symmetric Skin: Skin color, texture, turgor normal. No rashes or lesions Neurologic: Alert and oriented X 3, normal strength and tone. Normal symmetric reflexes. 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  I personally reviewed this EKG.  ASSESSMENT AND PLAN:   Essential hypertension, benign History of essential hypertension a blood pressure measured today at 115/78.  He is on amlodipine and quinapril as well as spironolactone.  OSA (obstructive sleep apnea) History of obstructive sleep apnea although he does not wear his CPAP.  Hyperlipidemia History of hyperlipidemia on pravastatin with recent lipid profile performed 01/18/2019 revealing total cholesterol 162, LDL 104 and HDL 28.  Chest pain History of atypical chest pain with a normal heart cath at [facility] 8 or 9 years ago and negative Myoview stress test 10/28/2016.  Shortness of breath History of shortness of breath for unclear reasons.  May be related to obesity, obstructive sleep apnea poorly treated.  He does not smoke.  I am going to recheck a 2D echo for further evaluation.  Palpitations History of palpitations for last  several years not occurring on a daily basis.  We will get a 2-week Zio patch to further evaluate      Lorretta Harp MD [facility], [facility] 07/12/2019 3:39 PM

## 2019-07-12 NOTE — Assessment & Plan Note (Addendum)
History of shortness of breath for unclear reasons.  May be related to obesity, obstructive sleep apnea poorly treated.  He does not smoke.  I am going to recheck a 2D echo for further evaluation.

## 2019-07-12 NOTE — Assessment & Plan Note (Signed)
History of atypical chest pain with a normal heart cath at [facility] 8 or 9 years ago and negative Myoview stress test 10/28/2016.

## 2019-07-12 NOTE — Assessment & Plan Note (Signed)
History of obstructive sleep apnea although he does not wear his CPAP.

## 2019-07-12 NOTE — Assessment & Plan Note (Signed)
History of essential hypertension a blood pressure measured today at 115/78.  He is on amlodipine and quinapril as well as spironolactone.

## 2019-07-12 NOTE — Addendum Note (Signed)
Addended by: Cain Sieve on: 07/12/2019 04:23 PM   Modules accepted: Orders

## 2019-07-12 NOTE — Assessment & Plan Note (Signed)
History of hyperlipidemia on pravastatin with recent lipid profile performed 01/18/2019 revealing total cholesterol 162, LDL 104 and HDL 28.

## 2019-07-14 ENCOUNTER — Encounter: Payer: Self-pay | Admitting: Gastroenterology

## 2019-07-14 ENCOUNTER — Ambulatory Visit: Payer: Commercial Managed Care - PPO | Admitting: Gastroenterology

## 2019-07-14 ENCOUNTER — Other Ambulatory Visit: Payer: Self-pay

## 2019-07-14 VITALS — BP 124/90 | HR 93 | Temp 97.1°F | Ht 72.0 in | Wt 271.4 lb

## 2019-07-14 DIAGNOSIS — K760 Fatty (change of) liver, not elsewhere classified: Secondary | ICD-10-CM

## 2019-07-14 DIAGNOSIS — Z860100 Personal history of colon polyps, unspecified: Secondary | ICD-10-CM

## 2019-07-14 DIAGNOSIS — R1310 Dysphagia, unspecified: Secondary | ICD-10-CM | POA: Diagnosis not present

## 2019-07-14 DIAGNOSIS — Z6836 Body mass index (BMI) 36.0-36.9, adult: Secondary | ICD-10-CM

## 2019-07-14 DIAGNOSIS — K219 Gastro-esophageal reflux disease without esophagitis: Secondary | ICD-10-CM | POA: Diagnosis not present

## 2019-07-14 DIAGNOSIS — E66812 Obesity, class 2: Secondary | ICD-10-CM

## 2019-07-14 NOTE — Progress Notes (Signed)
Chief Complaint: Dysphagia, chest pain   HPI:    Benjamin Chen is a 54 y.o. male with a history of colon polyps, hyperlipidemia, obesity (BMI 36.8), DM, HTN, and OSA, referred to the Gastroenterology Clinic for evaluation of dysphagia and chest pain.  He states he has had intermittent solid food dysphagia for the last year, slowly progressive. Sxs are sporadic. No odynophagia. No wt loss. Points to suprasternal notch. Will regurgitate food/vomit immediately after eating.   Also with reflux sxs, described as HB, regurgitation. Worse with spicy, tomato based sauces, chocolate, supine. Has eliminated caffeine. Has not trialed any medications, OTCs, etc. Occurs a few days/week now. No previous EGD.   Recently evaluated in the Cardiology clinic for atypical chestpain and palpitations.  Negative Myoview stress test 10/2016. Planning 2D echo and 2 week Zio patch.   No recent abdominal imaging for review since 2019.  No known family history of CRC, GI malignancy, liver disease, pancreatic disease, or IBD.   Endoscopic Hx: - Colonoscopy (01/2018, Dr. Bryan Lemma): 5 polyps in the ascending, transverse, descending, sigmoid (path: Tubular Adenomas), diminutive rectal hyperplastic polyps, internal hemorrhoids.  Recommended repeat in 3 years  - CT 03/2018: Hepatic steatosis - Abd [place] 03/2018: GB sludge and stones without cholecystitis, fatty liver   Past Medical History:  Diagnosis Date  . Allergy    seasonal  . Diabetes mellitus without complication ([place])   . GERD (gastroesophageal reflux disease)    occasional heartburn/no meds  . Gout   . Hyperlipidemia   . Hypertension   . Sleep apnea    uses a c-pap     Past Surgical History:  Procedure Laterality Date  . KNEE ARTHROSCOPY Right    right - torn meniscus  . KNEE ARTHROSCOPY Left    left - torn meniscus   Family History  Problem Relation Age of Onset  . Hyperlipidemia Mother   . Hypertension Mother   . Mental  illness Father   . Heart disease Maternal Aunt   . Diabetes Maternal Grandmother   . Heart disease Sister   . Hypertension Sister   . Thyroid disease Brother   . Colon cancer Neg Hx   . Esophageal cancer Neg Hx    Social History   Tobacco Use  . Smoking status: Never Smoker  . Smokeless tobacco: Never Used  Substance Use Topics  . Alcohol use: Yes    Alcohol/week: 2.0 standard drinks    Types: 2 Standard drinks or equivalent per week  . Drug use: No   Current Outpatient Medications  Medication Sig Dispense Refill  . allopurinol (ZYLOPRIM) 100 MG tablet TAKE 2 TABLETS BY MOUTH EVERY DAY 60 tablet 5  . amLODipine (NORVASC) 5 MG tablet Take 1 tablet (5 mg total) by mouth daily. 30 tablet 2  . empagliflozin (JARDIANCE) 10 MG TABS tablet Take 10 mg by mouth daily. Follow-up appt due in March must see provider for future refills 30 tablet 2  . JANUVIA 100 MG tablet TAKE 1 TABLET BY MOUTH EVERY DAY 30 tablet 5  . metFORMIN (GLUCOPHAGE-XR) 750 MG 24 hr tablet TAKE 2 TABLETS (1,500 MG TOTAL) BY MOUTH DAILY WITH BREAKFAST. 60 tablet 5  . pravastatin (PRAVACHOL) 10 MG tablet Take 1 tablet (10 mg total) by mouth daily. 30 tablet 5  . quinapril (ACCUPRIL) 20 MG tablet TAKE 1 TABLET BY MOUTH TWICE A DAY 60 tablet 5  . spironolactone (ALDACTONE) 25 MG  tablet TAKE 1 TABLET BY MOUTH EVERY DAY 90 tablet 0   No current facility-administered medications for this visit.   Allergies  Allergen Reactions  . Penicillins Swelling    Pt  unsure about how much or where the swelling was.     Review of Systems: All systems reviewed and negative except where noted in HPI.     Physical Exam:    Wt Readings from Last 3 Encounters:  07/14/19 271 lb 6 oz (123.1 kg)  07/12/19 272 lb 6.4 oz (123.6 kg)  01/18/19 278 lb (126.1 kg)    BP 124/90   Pulse 93   Temp (!) 97.1 F (36.2 C)   Ht 6' (1.829 m)   Wt 271 lb 6 oz (123.1 kg)   BMI 36.81 kg/m  Constitutional:  Pleasant, in no acute  distress. Psychiatric: Normal mood and affect. Behavior is normal. EENT: Pupils normal.  Conjunctivae are normal. No scleral icterus. Neck supple. No cervical LAD. Cardiovascular: Normal rate, regular rhythm. No edema Pulmonary/chest: Effort normal and breath sounds normal. No wheezing, rales or rhonchi. Abdominal: Soft, nondistended, nontender. Bowel sounds active throughout. There are no masses palpable. No hepatomegaly. Neurological: Alert and oriented to person place and time. Skin: Skin is warm and dry. No rashes noted.   ASSESSMENT AND PLAN;   1) Dysphagia 2) GERD Clinical presentation seems most consistent with reflux with both typical and atypical symptoms.  Discussed trial of high-dose PPI therapy x4 weeks for diagnostic and therapeutic intent.  However, he does not want to trial new medications as he already takes a number of medications and would rather further diagnostic work-up first. -EGD with esophageal dilation -Assess for erosive esophagitis, LES laxity, hiatal hernia at time of EGD.  Preoperative assessment in the event that he would not want antireflux surgery in the future -Schedule procedure Cardiology evaluation and clearance  3) Obesity (BMI 36.8): -Central adiposity can certainly provoke reflux  4) Hepatic steatosis: -Hepatic steatosis noted on CT ultrasound thousand 19.  Liver enzymes otherwise normal.  Treated underlying diabetes, hypertension, hyperlipidemia as already doing, weight loss 10%, and otherwise conservative management from a liver standpoint given otherwise preserved hepatic synthetic function on previous studies -Repeat liver enzymes with next set of labs  5) History of colon polyps: -Colonoscopy in 2019 multiple tubular adenomas with recommendation repeat in 3 years -Colonoscopy in 2020 for ongoing polyp surveillance  6) Palpitations: -Scheduled for Zio patch 2D echo -Follow-up with Cardiology as planned.  Will schedule EGD pending completion  Cardiology work-up clearance to proceed  The indications, risks, and benefits of EGD were explained to the patient in detail. Risks include but are not limited to bleeding, perforation, adverse reaction to medications, and cardiopulmonary compromise. Sequelae include but are not limited to the possibility of surgery, hositalization, and mortality. The patient verbalized understanding and wished to proceed pending Cardiology work-up.    Lavena Bullion, DO, FACG  07/14/2019, 8:42 AM   Quay Burow, Claudina Lick, MD

## 2019-07-14 NOTE — Patient Instructions (Addendum)
It has been recommended to you by your physician that you have a(n) EGD completed. Per your request, we did not schedule the procedure(s) today. Please contact our office at [phone] should you decide to have the procedure completed. You will be scheduled for a pre-visit and procedure at that time We will contact you next week and your Cardiologist to schedule your procedure.   It was a pleasure to see you today!  Vito Cirigliano, D.O.

## 2019-07-15 ENCOUNTER — Other Ambulatory Visit (INDEPENDENT_AMBULATORY_CARE_PROVIDER_SITE_OTHER): Payer: Commercial Managed Care - PPO

## 2019-07-15 DIAGNOSIS — E782 Mixed hyperlipidemia: Secondary | ICD-10-CM

## 2019-07-15 DIAGNOSIS — I10 Essential (primary) hypertension: Secondary | ICD-10-CM

## 2019-07-15 DIAGNOSIS — G4733 Obstructive sleep apnea (adult) (pediatric): Secondary | ICD-10-CM

## 2019-07-15 DIAGNOSIS — R002 Palpitations: Secondary | ICD-10-CM | POA: Diagnosis not present

## 2019-07-16 NOTE — Patient Instructions (Addendum)
  Blood work was ordered.   ° ° °Medications reviewed and updated.  Changes include :   none ° ° ° °Please followup in 6 months ° ° °

## 2019-07-16 NOTE — Progress Notes (Signed)
Subjective:    Patient ID: Benjamin Chen, male    DOB: [birth year], 54 y.o.   MRN: [id number]  HPI The patient is here for follow up of their chronic medical problems, including hypertension, diabetes, hyperlipidemia, gout, sleep apnea and morbid obesity.  He is taking all of his medications as prescribed.    He is not exercising regularly.   He feels he is eating pretty well, but could do better.   We had a visit recently for fatigue, shortness of breath.  I did refer him to cardiology and pulmonary.  Cardiology has ordered a 2D echo and a Holter monitor.  He sees pulmonary today.  He stopped the allopurinol and he no longer has a sore throat, palpations and breathing issues.      Medications and allergies reviewed with patient and updated if appropriate.  Patient Active Problem List   Diagnosis Date Noted  . Shortness of breath 07/03/2019  . Chest tightness 07/03/2019  . Palpitations 07/03/2019  . GERD (gastroesophageal reflux disease) 07/03/2019  . Low testosterone in male 01/18/2019  . Fatty liver 03/27/2018  . Cholelithiasis 03/27/2018  . Fatigue 10/13/2017  . Obesity, morbid ([place]) 01/12/2017  . Chest pain 10/07/2016  . Erectile dysfunction 10/07/2016  . Gout 01/29/2016  . Arthralgia 01/24/2016  . Hyperlipidemia 01/24/2016  . Essential hypertension, benign 12/30/2015  . Diabetes ([place]) 12/30/2015  . OSA (obstructive sleep apnea) 12/30/2015  . Bilateral primary osteoarthritis of knee 12/30/2015  . Joint pain 12/30/2015    Current Outpatient Medications on File Prior to Visit  Medication Sig Dispense Refill  . amLODipine (NORVASC) 5 MG tablet Take 1 tablet (5 mg total) by mouth daily. 30 tablet 2  . empagliflozin (JARDIANCE) 10 MG TABS tablet Take 10 mg by mouth daily. Follow-up appt due in March must see provider for future refills 30 tablet 2  . JANUVIA 100 MG tablet TAKE 1 TABLET BY MOUTH EVERY DAY 30 tablet 5  . metFORMIN (GLUCOPHAGE-XR) 750 MG 24 hr tablet  TAKE 2 TABLETS (1,500 MG TOTAL) BY MOUTH DAILY WITH BREAKFAST. 60 tablet 5  . pravastatin (PRAVACHOL) 10 MG tablet Take 1 tablet (10 mg total) by mouth daily. 30 tablet 5  . quinapril (ACCUPRIL) 20 MG tablet TAKE 1 TABLET BY MOUTH TWICE A DAY 60 tablet 5  . spironolactone (ALDACTONE) 25 MG tablet TAKE 1 TABLET BY MOUTH EVERY DAY 90 tablet 0  . allopurinol (ZYLOPRIM) 100 MG tablet TAKE 2 TABLETS BY MOUTH EVERY DAY (Patient not taking: Reported on 07/18/2019) 60 tablet 5   No current facility-administered medications on file prior to visit.    Past Medical History:  Diagnosis Date  . Allergy    seasonal  . Diabetes mellitus without complication ([place])   . GERD (gastroesophageal reflux disease)    occasional heartburn/no meds  . Gout   . Hyperlipidemia   . Hypertension   . Sleep apnea    uses a c-pap    Past Surgical History:  Procedure Laterality Date  . KNEE ARTHROSCOPY Right    right - torn meniscus  . KNEE ARTHROSCOPY Left    left - torn meniscus    Social History   Socioeconomic History  . Marital status: Married    Spouse name: Not on file  . Number of children: Not on file  . Years of education: Not on file  . Highest education level: Not on file  Occupational History  . Not on file  Tobacco Use  . Smoking  status: Never Smoker  . Smokeless tobacco: Never Used  Substance and Sexual Activity  . Alcohol use: Yes    Alcohol/week: 2.0 standard drinks    Types: 2 Standard drinks or equivalent per week  . Drug use: No  . Sexual activity: Not on file  Other Topics Concern  . Not on file  Social History Narrative  . Not on file   Social Determinants of Health   Financial Resource Strain:   . Difficulty of Paying Living Expenses: Not on file  Food Insecurity:   . Worried About Charity fundraiser in the Last Year: Not on file  . Ran Out of Food in the Last Year: Not on file  Transportation Needs:   . Lack of Transportation (Medical): Not on file  . Lack of  Transportation (Non-Medical): Not on file  Physical Activity:   . Days of Exercise per Week: Not on file  . Minutes of Exercise per Session: Not on file  Stress:   . Feeling of Stress : Not on file  Social Connections:   . Frequency of Communication with Friends and Family: Not on file  . Frequency of Social Gatherings with Friends and Family: Not on file  . Attends Religious Services: Not on file  . Active Member of Clubs or Organizations: Not on file  . Attends Archivist Meetings: Not on file  . Marital Status: Not on file    Family History  Problem Relation Age of Onset  . Hyperlipidemia Mother   . Hypertension Mother   . Mental illness Father   . Heart disease Maternal Aunt   . Diabetes Maternal Grandmother   . Heart disease Sister   . Hypertension Sister   . Thyroid disease Brother   . Colon cancer Neg Hx   . Esophageal cancer Neg Hx     Review of Systems  Constitutional: Negative for chills, fatigue and fever.  Respiratory: Negative for cough, shortness of breath and wheezing.   Cardiovascular: Negative for chest pain, palpitations and leg swelling.  Neurological: Negative for dizziness, light-headedness and headaches.       Objective:   Vitals:   07/18/19 0749  BP: 124/82  Pulse: 76  Resp: 16  Temp: 98.1 F (36.7 C)  SpO2: 99%   BP Readings from Last 3 Encounters:  07/18/19 124/82  07/14/19 124/90  07/12/19 115/78   Wt Readings from Last 3 Encounters:  07/18/19 275 lb (124.7 kg)  07/14/19 271 lb 6 oz (123.1 kg)  07/12/19 272 lb 6.4 oz (123.6 kg)   Body mass index is 37.3 kg/m.   Physical Exam    Constitutional: Appears well-developed and well-nourished. No distress.  HENT:  Head: Normocephalic and atraumatic.  Neck: Neck supple. No tracheal deviation present. No thyromegaly present.  No cervical lymphadenopathy Cardiovascular: Normal rate, regular rhythm and normal heart sounds.   No murmur heard. No carotid bruit .  No  edema Pulmonary/Chest: Effort normal and breath sounds normal. No respiratory distress. No has no wheezes. No rales.  Skin: Skin is warm and dry. Not diaphoretic.  Psychiatric: Normal mood and affect. Behavior is normal.      Assessment & Plan:    See Problem List for Assessment and Plan of chronic medical problems.    This visit occurred during the SARS-CoV-2 public health emergency.  Safety protocols were in place, including screening questions prior to the visit, additional usage of staff PPE, and extensive cleaning of exam room while observing appropriate contact  time as indicated for disinfecting solutions.

## 2019-07-18 ENCOUNTER — Ambulatory Visit: Payer: Commercial Managed Care - PPO | Admitting: Internal Medicine

## 2019-07-18 ENCOUNTER — Encounter: Payer: Self-pay | Admitting: Pulmonary Disease

## 2019-07-18 ENCOUNTER — Other Ambulatory Visit: Payer: Self-pay

## 2019-07-18 ENCOUNTER — Encounter: Payer: Self-pay | Admitting: Internal Medicine

## 2019-07-18 ENCOUNTER — Ambulatory Visit: Payer: Commercial Managed Care - PPO | Admitting: Pulmonary Disease

## 2019-07-18 ENCOUNTER — Ambulatory Visit (INDEPENDENT_AMBULATORY_CARE_PROVIDER_SITE_OTHER): Payer: Commercial Managed Care - PPO

## 2019-07-18 VITALS — BP 122/78 | HR 96 | Temp 97.3°F | Ht 72.0 in | Wt 274.8 lb

## 2019-07-18 VITALS — BP 124/82 | HR 76 | Temp 98.1°F | Resp 16 | Ht 72.0 in | Wt 275.0 lb

## 2019-07-18 DIAGNOSIS — E119 Type 2 diabetes mellitus without complications: Secondary | ICD-10-CM

## 2019-07-18 DIAGNOSIS — R0602 Shortness of breath: Secondary | ICD-10-CM

## 2019-07-18 DIAGNOSIS — E782 Mixed hyperlipidemia: Secondary | ICD-10-CM

## 2019-07-18 DIAGNOSIS — Z125 Encounter for screening for malignant neoplasm of prostate: Secondary | ICD-10-CM

## 2019-07-18 DIAGNOSIS — I10 Essential (primary) hypertension: Secondary | ICD-10-CM

## 2019-07-18 LAB — COMPREHENSIVE METABOLIC PANEL
ALT: 18 U/L (ref 0–53)
AST: 12 U/L (ref 0–37)
Albumin: 4.1 g/dL (ref 3.5–5.2)
Alkaline Phosphatase: 88 U/L (ref 39–117)
BUN: 18 mg/dL (ref 6–23)
CO2: 26 mEq/L (ref 19–32)
Calcium: 9.8 mg/dL (ref 8.4–10.5)
Chloride: 101 mEq/L (ref 96–112)
Creatinine, Ser: 1.26 mg/dL (ref 0.40–1.50)
GFR: 72.65 mL/min (ref >60.00)
Glucose, Bld: 106 mg/dL — ABNORMAL HIGH (ref 70–99)
Potassium: 4.3 mEq/L (ref 3.5–5.1)
Sodium: 135 mEq/L (ref 135–145)
Total Bilirubin: 0.8 mg/dL (ref 0.2–1.2)
Total Protein: 6.8 g/dL (ref 6.0–8.3)

## 2019-07-18 LAB — LIPID PANEL
Cholesterol: 141 mg/dL (ref 0–200)
HDL: 27.5 mg/dL — ABNORMAL LOW (ref >39.00)
LDL Cholesterol: 81 mg/dL (ref 0–99)
NonHDL: 113.24
Total CHOL/HDL Ratio: 5
Triglycerides: 161 mg/dL — ABNORMAL HIGH (ref 0.0–149.0)
VLDL: 32.2 mg/dL (ref 0.0–40.0)

## 2019-07-18 LAB — HEMOGLOBIN A1C: Hgb A1c MFr Bld: 6.7 % — ABNORMAL HIGH (ref 4.6–6.5)

## 2019-07-18 LAB — CBC WITH DIFFERENTIAL/PLATELET
Basophils Absolute: 0.1 10*3/uL (ref 0.0–0.1)
Basophils Relative: 0.7 % (ref 0.0–3.0)
Eosinophils Absolute: 0.1 10*3/uL (ref 0.0–0.7)
Eosinophils Relative: 1.5 % (ref 0.0–5.0)
HCT: 46.9 % (ref 39.0–52.0)
Hemoglobin: 15.9 g/dL (ref 13.0–17.0)
Lymphocytes Relative: 34.1 % (ref 12.0–46.0)
Lymphs Abs: 3 10*3/uL (ref 0.7–4.0)
MCHC: 34 g/dL (ref 30.0–36.0)
MCV: 78.5 fl (ref 78.0–100.0)
Monocytes Absolute: 0.7 10*3/uL (ref 0.1–1.0)
Monocytes Relative: 8 % (ref 3.0–12.0)
Neutro Abs: 4.9 10*3/uL (ref 1.4–7.7)
Neutrophils Relative %: 55.7 % (ref 43.0–77.0)
Platelets: 350 10*3/uL (ref 150.0–400.0)
RBC: 5.97 Mil/uL — ABNORMAL HIGH (ref 4.22–5.81)
RDW: 16 % — ABNORMAL HIGH (ref 11.5–15.5)
WBC: 8.7 10*3/uL (ref 4.0–10.5)

## 2019-07-18 LAB — TSH: TSH: 2.31 u[IU]/mL (ref 0.35–4.50)

## 2019-07-18 NOTE — Patient Instructions (Signed)
Shortness of breath  We will get a breathing study Chest x-ray  Tentatively see in about 3 months  Resume using CPAP on a regular basis Regular graded exercises as tolerated  Call with significant concerns

## 2019-07-18 NOTE — Assessment & Plan Note (Signed)
Chronic Check lipid panel, tsh, cmp  Continue daily statin Regular exercise and healthy diet encouraged

## 2019-07-18 NOTE — Assessment & Plan Note (Signed)
Chronic Taking Januvia 100 mg daily, Metformin 1500 mg XR daily and Jardiance 10 mg daily Check a1c Low sugar / carb diet Stressed regular exercise Stressed weight loss

## 2019-07-18 NOTE — Assessment & Plan Note (Signed)
Chronic Stressed the importance of weight loss Has comorbidities of hypertension, sleep apnea, diabetes, hyperlipidemia Stressed the importance of regular exercise, healthy diet and decreased portions Follow-up in 6 months

## 2019-07-18 NOTE — Assessment & Plan Note (Signed)
Chronic BP well controlled Current regimen effective and well tolerated Continue current medications at current doses cmp  

## 2019-07-18 NOTE — Progress Notes (Signed)
Subjective:    Patient ID: Benjamin Chen, male    DOB: 1965/07/15, 54 y.o.   MRN: [id number]  Patient with shortness of breath  Usually happens at rest or with minimal activity Says he does not really get short of breath with moderate to significant activity Does not exercise on a regular basis  Was diagnosed with obstructive sleep apnea about 2018 Use CPAP sparingly and then got out of using it on a regular basis  He feels his breathing is choppy, happens at rest, happens with minimal activity Does not have any underlying history of lung disease Never smoked No history of heart disease  He does have a history of hypertension, diabetes, hypercholesterolemia  No pertinent occupational history No significant exposures  Past Medical History:  Diagnosis Date  . Allergy    seasonal  . Diabetes mellitus without complication ([place])   . GERD (gastroesophageal reflux disease)    occasional heartburn/no meds  . Gout   . Hyperlipidemia   . Hypertension   . Sleep apnea    uses a c-pap   Social History   Socioeconomic History  . Marital status: Married    Spouse name: Not on file  . Number of children: Not on file  . Years of education: Not on file  . Highest education level: Not on file  Occupational History  . Not on file  Tobacco Use  . Smoking status: Never Smoker  . Smokeless tobacco: Never Used  Substance and Sexual Activity  . Alcohol use: Yes    Alcohol/week: 2.0 standard drinks    Types: 2 Standard drinks or equivalent per week  . Drug use: No  . Sexual activity: Not on file  Other Topics Concern  . Not on file  Social History Narrative  . Not on file   Social Determinants of Health   Financial Resource Strain:   . Difficulty of Paying Living Expenses: Not on file  Food Insecurity:   . Worried About Charity fundraiser in the Last Year: Not on file  . Ran Out of Food in the Last Year: Not on file  Transportation Needs:   . Lack of Transportation  (Medical): Not on file  . Lack of Transportation (Non-Medical): Not on file  Physical Activity:   . Days of Exercise per Week: Not on file  . Minutes of Exercise per Session: Not on file  Stress:   . Feeling of Stress : Not on file  Social Connections:   . Frequency of Communication with Friends and Family: Not on file  . Frequency of Social Gatherings with Friends and Family: Not on file  . Attends Religious Services: Not on file  . Active Member of Clubs or Organizations: Not on file  . Attends Archivist Meetings: Not on file  . Marital Status: Not on file  Intimate Partner Violence:   . Fear of Current or Ex-Partner: Not on file  . Emotionally Abused: Not on file  . Physically Abused: Not on file  . Sexually Abused: Not on file   Family History  Problem Relation Age of Onset  . Hyperlipidemia Mother   . Hypertension Mother   . Mental illness Father   . Heart disease Maternal Aunt   . Diabetes Maternal Grandmother   . Heart disease Sister   . Hypertension Sister   . Thyroid disease Brother   . Colon cancer Neg Hx   . Esophageal cancer Neg Hx    Review of Systems  Constitutional: Negative for fever and unexpected weight change.  HENT: Positive for sore throat and trouble swallowing. Negative for congestion, dental problem, ear pain, nosebleeds, postnasal drip, rhinorrhea, sinus pressure and sneezing.   Eyes: Negative for redness and itching.  Respiratory: Positive for shortness of breath. Negative for cough, chest tightness and wheezing.   Cardiovascular: Negative for palpitations and leg swelling.  Gastrointestinal: Negative for nausea and vomiting.  Genitourinary: Negative for dysuria.  Musculoskeletal: Negative for joint swelling.  Skin: Negative for rash.  Allergic/Immunologic: Negative.  Negative for environmental allergies, food allergies and immunocompromised state.  Neurological: Negative for headaches.  Hematological: Does not bruise/bleed easily.    Psychiatric/Behavioral: Positive for dysphoric mood. The patient is not nervous/anxious.       Objective:   Physical Exam Constitutional:      Appearance: He is obese.  HENT:     Head: Normocephalic.     Mouth/Throat:     Mouth: Mucous membranes are moist.     Comments: Crowded oropharynx, Mallampati 2 Eyes:     Extraocular Movements: Extraocular movements intact.     Pupils: Pupils are equal, round, and reactive to light.  Cardiovascular:     Rate and Rhythm: Normal rate and regular rhythm.     Pulses: Normal pulses.     Heart sounds: Normal heart sounds. No murmur. No friction rub.  Pulmonary:     Effort: Pulmonary effort is normal. No respiratory distress.     Breath sounds: Normal breath sounds. No stridor. No wheezing or rhonchi.  Musculoskeletal:        General: Normal range of motion.     Cervical back: Normal range of motion and neck supple. No rigidity or tenderness.  Neurological:     General: No focal deficit present.     Mental Status: He is alert.  Psychiatric:        Mood and Affect: Mood normal.    Vitals:   07/18/19 1503  BP: 122/78  Pulse: 96  Temp: (!) 97.3 F (36.3 C)  SpO2: 98%   Results of the Epworth flowsheet 01/18/2017  Sitting and reading 2  Watching TV 1  Sitting, inactive in a public place (e.g. a theatre or a meeting) 1  As a passenger in a car for an hour without a break 1  Lying down to rest in the afternoon when circumstances permit 3  Sitting and talking to someone 0  Sitting quietly after a lunch without alcohol 0  In a car, while stopped for a few minutes in traffic 1  Total score 9   Previous study reviewed showing mild obstructive sleep apnea    Assessment & Plan:  .  Mild obstructive sleep apnea .  Shortness of breath -Shortness of breath happens at rest and also with minimal exertion -Denies significant shortness of breath on moderate to significant exertion Obesity   Plan .  Obtain chest x-ray .  Obtain pulmonary  function study .  Regular exercises .  Encouraged to get back into using CPAP on a regular basis  .  We will see him tentatively about 3 months  .  I do doubt significant lung disease .  Some of his symptoms may be related to deconditioning  Call with any significant symptoms .

## 2019-07-19 LAB — PSA, TOTAL AND FREE
PSA, % Free: 33 % (calc) (ref >25)
PSA, Free: 0.1 ng/mL
PSA, Total: 0.3 ng/mL (ref <=4.0)

## 2019-07-20 ENCOUNTER — Encounter: Payer: Self-pay | Admitting: Internal Medicine

## 2019-07-21 LAB — HM DIABETES EYE EXAM

## 2019-08-02 ENCOUNTER — Ambulatory Visit (HOSPITAL_COMMUNITY): Payer: Commercial Managed Care - PPO | Attending: Cardiology

## 2019-08-02 ENCOUNTER — Other Ambulatory Visit: Payer: Self-pay | Admitting: Internal Medicine

## 2019-08-02 ENCOUNTER — Other Ambulatory Visit: Payer: Self-pay

## 2019-08-02 DIAGNOSIS — E782 Mixed hyperlipidemia: Secondary | ICD-10-CM | POA: Diagnosis not present

## 2019-08-02 DIAGNOSIS — G4733 Obstructive sleep apnea (adult) (pediatric): Secondary | ICD-10-CM

## 2019-08-02 DIAGNOSIS — I10 Essential (primary) hypertension: Secondary | ICD-10-CM | POA: Diagnosis not present

## 2019-08-07 ENCOUNTER — Ambulatory Visit: Payer: Commercial Managed Care - PPO | Attending: Internal Medicine

## 2019-08-07 ENCOUNTER — Ambulatory Visit: Payer: Commercial Managed Care - PPO

## 2019-08-07 DIAGNOSIS — Z23 Encounter for immunization: Secondary | ICD-10-CM

## 2019-08-07 NOTE — Progress Notes (Signed)
   Covid-19 Vaccination Clinic  Name:  Benjamin Chen    MRN: [id number] DOB: April 29, 1966  08/07/2019  [name] was observed post Covid-19 immunization for 15 minutes without incident. He was provided with Vaccine Information Sheet and instruction to access the V-Safe system.   [name] was instructed to call 911 with any severe reactions post vaccine: Marland Kitchen Difficulty breathing  . Swelling of face and throat  . A fast heartbeat  . A bad rash all over body  . Dizziness and weakness   Immunizations Administered    Name Date Dose VIS Date Route   Pfizer COVID-19 Vaccine 08/07/2019 11:30 AM 0.3 mL 04/21/2019 Intramuscular   Manufacturer: [place]   Lot: [id number]   [place]: [id number]

## 2019-08-10 ENCOUNTER — Encounter: Payer: Self-pay | Admitting: Internal Medicine

## 2019-08-19 ENCOUNTER — Other Ambulatory Visit: Payer: Self-pay | Admitting: Internal Medicine

## 2019-08-30 ENCOUNTER — Ambulatory Visit: Payer: Commercial Managed Care - PPO | Attending: Internal Medicine

## 2019-08-30 DIAGNOSIS — Z23 Encounter for immunization: Secondary | ICD-10-CM

## 2019-08-30 NOTE — Progress Notes (Signed)
   Covid-19 Vaccination Clinic  Name:  Benjamin Chen    MRN: [id number] DOB: Apr 18, 1966  08/30/2019  [name] was observed post Covid-19 immunization for 15 minutes without incident. He was provided with Vaccine Information Sheet and instruction to access the V-Safe system.   [name] was instructed to call 911 with any severe reactions post vaccine: Marland Kitchen Difficulty breathing  . Swelling of face and throat  . A fast heartbeat  . A bad rash all over body  . Dizziness and weakness   Immunizations Administered    Name Date Dose VIS Date Route   Pfizer COVID-19 Vaccine 08/30/2019  9:36 AM 0.3 mL 07/05/2018 Intramuscular   Manufacturer: [place]   Lot: [id number]   [place]: [id number]

## 2019-10-03 ENCOUNTER — Other Ambulatory Visit (HOSPITAL_COMMUNITY): Payer: Commercial Managed Care - PPO

## 2019-10-13 ENCOUNTER — Ambulatory Visit: Payer: Commercial Managed Care - PPO | Admitting: Cardiovascular Disease

## 2019-11-09 ENCOUNTER — Other Ambulatory Visit: Payer: Self-pay | Admitting: Internal Medicine

## 2019-11-11 ENCOUNTER — Other Ambulatory Visit: Payer: Self-pay | Admitting: Internal Medicine

## 2019-12-09 ENCOUNTER — Other Ambulatory Visit: Payer: Self-pay | Admitting: Internal Medicine

## 2019-12-10 DIAGNOSIS — M179 Osteoarthritis of knee, unspecified: Secondary | ICD-10-CM

## 2019-12-10 DIAGNOSIS — M1710 Unilateral primary osteoarthritis, unspecified knee: Secondary | ICD-10-CM

## 2020-01-01 ENCOUNTER — Encounter: Payer: Self-pay | Admitting: Internal Medicine

## 2020-01-03 ENCOUNTER — Telehealth: Payer: Self-pay

## 2020-01-03 NOTE — Telephone Encounter (Signed)
   Primary Cardiologist:  Dr Gwenlyn Found  Chart reviewed as part of pre-operative protocol coverage.  The patient was contacted today by phone.  He had seen Dr. Alvester Chou in March 2021 with complaints of dyspnea on exertion.  He had a history of a prior negative nuclear stress in 2018.  Echocardiogram and monitor done in March 2021 were basically unrevealing.  The patient tells me his symptoms have since gotten much better with some lifestyle changes.  Given past medical history and time since last visit, based on ACC/AHA guidelines, Ennio Houp would be at acceptable risk for the planned procedure without further cardiovascular testing.   I will route this recommendation to the requesting party via Epic fax function and remove from pre-op pool.  Please call with questions.  Kerin Ransom, PA-C 01/03/2020, 10:01 AM

## 2020-01-03 NOTE — Telephone Encounter (Signed)
   [facility] Pre-operative Risk Assessment     Request for surgical clearance:  1. What type of surgery is being performed? RIGHT TOTAL KNEE ARTHROPLASTY    2. When is this surgery scheduled? TBD   3. What type of clearance is required (medical clearance vs. Pharmacy clearance to hold med vs. Both)? MEDICAL  4. Are there any medications that need to be held prior to surgery and how long? NONE  5. Practice name and name of physician performing surgery? [facility] Lowella Petties, MD ATTN:JUDY  6. What is the office phone number? [phone]   7.   What is the office fax number? [phone]  8.   Anesthesia type (None, local, MAC, general) ? SPINAL

## 2020-01-07 NOTE — Progress Notes (Signed)
Subjective:    Patient ID: Benjamin Chen, male    DOB: [birth year], 54 y.o.   MRN: [id number]  HPI He is here for pre-operative clearance at the request of Dr Dorna Leitz for right Total knee Arthroplasty with spinal anesthesia scheduled for TBD.  He is also here for routine follow up.   He has no concerns.  He is taking all his medication as prescribed.  He does not check his sugars.    He denies any personal or family history of problems with anesthesia or bleeding/blood clot problems.      He is not exercising regularly.  With his daily activities he denies chest pain, palpitations, SOB and lightheadedness.       Medications and allergies reviewed with patient and updated if appropriate.  Patient Active Problem List   Diagnosis Date Noted  . Shortness of breath 07/03/2019  . Palpitations 07/03/2019  . GERD (gastroesophageal reflux disease) 07/03/2019  . Low testosterone in male 01/18/2019  . Fatty liver 03/27/2018  . Cholelithiasis 03/27/2018  . Fatigue 10/13/2017  . Obesity, morbid ([place]) 01/12/2017  . Chest pain 10/07/2016  . Erectile dysfunction 10/07/2016  . Gout 01/29/2016  . Arthralgia 01/24/2016  . Hyperlipidemia 01/24/2016  . Essential hypertension, benign 12/30/2015  . Diabetes ([place]) 12/30/2015  . OSA (obstructive sleep apnea) 12/30/2015  . Bilateral primary osteoarthritis of knee 12/30/2015  . Joint pain 12/30/2015    Current Outpatient Medications on File Prior to Visit  Medication Sig Dispense Refill  . amLODipine (NORVASC) 5 MG tablet TAKE 1 TABLET BY MOUTH EVERY DAY 90 tablet 1  . empagliflozin (JARDIANCE) 10 MG TABS tablet Take 10 mg by mouth daily. 90 tablet 1  . JANUVIA 100 MG tablet TAKE 1 TABLET BY MOUTH EVERY DAY 30 tablet 5  . metFORMIN (GLUCOPHAGE-XR) 750 MG 24 hr tablet TAKE 2 TABLETS (1,500 MG TOTAL) BY MOUTH DAILY WITH BREAKFAST. 60 tablet 5  . pravastatin (PRAVACHOL) 10 MG tablet TAKE 1 TABLET BY MOUTH EVERY DAY 30 tablet 5  . quinapril  (ACCUPRIL) 20 MG tablet TAKE 1 TABLET BY MOUTH TWICE A DAY 60 tablet 5  . spironolactone (ALDACTONE) 25 MG tablet TAKE 1 TABLET BY MOUTH EVERY DAY 30 tablet 5   No current facility-administered medications on file prior to visit.    Past Medical History:  Diagnosis Date  . Allergy    seasonal  . Diabetes mellitus without complication ([place])   . GERD (gastroesophageal reflux disease)    occasional heartburn/no meds  . Gout   . Hyperlipidemia   . Hypertension   . Sleep apnea    uses a c-pap    Past Surgical History:  Procedure Laterality Date  . KNEE ARTHROSCOPY Right    right - torn meniscus  . KNEE ARTHROSCOPY Left    left - torn meniscus    Social History   Socioeconomic History  . Marital status: Married    Spouse name: Not on file  . Number of children: Not on file  . Years of education: Not on file  . Highest education level: Not on file  Occupational History  . Not on file  Tobacco Use  . Smoking status: Never Smoker  . Smokeless tobacco: Never Used  Vaping Use  . Vaping Use: Never used  Substance and Sexual Activity  . Alcohol use: Yes    Alcohol/week: 2.0 standard drinks    Types: 2 Standard drinks or equivalent per week  . Drug use: No  .  Sexual activity: Not on file  Other Topics Concern  . Not on file  Social History Narrative  . Not on file   Social Determinants of Health   Financial Resource Strain:   . Difficulty of Paying Living Expenses: Not on file  Food Insecurity:   . Worried About Charity fundraiser in the Last Year: Not on file  . Ran Out of Food in the Last Year: Not on file  Transportation Needs:   . Lack of Transportation (Medical): Not on file  . Lack of Transportation (Non-Medical): Not on file  Physical Activity:   . Days of Exercise per Week: Not on file  . Minutes of Exercise per Session: Not on file  Stress:   . Feeling of Stress : Not on file  Social Connections:   . Frequency of Communication with Friends and Family:  Not on file  . Frequency of Social Gatherings with Friends and Family: Not on file  . Attends Religious Services: Not on file  . Active Member of Clubs or Organizations: Not on file  . Attends Archivist Meetings: Not on file  . Marital Status: Not on file    Family History  Problem Relation Age of Onset  . Hyperlipidemia Mother   . Hypertension Mother   . Mental illness Father   . Heart disease Maternal Aunt   . Diabetes Maternal Grandmother   . Heart disease Sister   . Hypertension Sister   . Thyroid disease Brother   . Colon cancer Neg Hx   . Esophageal cancer Neg Hx     Review of Systems  Constitutional: Negative for chills and fever.  Respiratory: Negative for cough, shortness of breath and wheezing.   Cardiovascular: Negative for chest pain, palpitations and leg swelling.  Gastrointestinal: Negative for abdominal pain, blood in stool, constipation, diarrhea and nausea.       No gerd  Genitourinary: Negative for dysuria and hematuria.  Skin: Negative for rash.  Neurological: Negative for dizziness, light-headedness, numbness and headaches.       Objective:   Vitals:   01/08/20 1419  BP: 128/82  Pulse: 92  Temp: 98 F (36.7 C)  SpO2: 96%   Filed Weights   01/08/20 1419  Weight: 269 lb 12.8 oz (122.4 kg)   Body mass index is 36.59 kg/m.  BP Readings from Last 3 Encounters:  01/08/20 128/82  07/18/19 122/78  07/18/19 124/82    Wt Readings from Last 3 Encounters:  01/08/20 269 lb 12.8 oz (122.4 kg)  07/18/19 274 lb 12.8 oz (124.6 kg)  07/18/19 275 lb (124.7 kg)     Physical Exam Constitutional: He appears well-developed and well-nourished. No distress.  HENT:  Head: Normocephalic and atraumatic.  Right Ear: External ear normal.  Left Ear: External ear normal.  Eyes: Conjunctivae and EOM are normal.  Neck: Neck supple. No tracheal deviation present. No thyromegaly present. No carotid bruit  Cardiovascular: Normal rate, regular rhythm,  normal heart sounds and intact distal pulses.   No murmur heard. Pulmonary/Chest: Effort normal and breath sounds normal. No respiratory distress. He has no wheezes. He has no rales.  Abdominal: Soft. He exhibits no distension. There is no tenderness.  Musculoskeletal: He exhibits no edema.  Lymphadenopathy:   He has no cervical adenopathy.  Skin: Skin is warm and dry. He is not diaphoretic.  Psychiatric: He has a normal mood and affect. His behavior is normal.         Assessment & Plan:  See Problem List for Assessment and Plan of chronic medical problems.   This visit occurred during the SARS-CoV-2 public health emergency.  Safety protocols were in place, including screening questions prior to the visit, additional usage of staff PPE, and extensive cleaning of exam room while observing appropriate contact time as indicated for disinfecting solutions.

## 2020-01-07 NOTE — Patient Instructions (Addendum)
  Blood work was ordered.   ° ° °Medications reviewed and updated.  Changes include :   none ° ° ° °Please followup in 6 months ° ° °

## 2020-01-08 ENCOUNTER — Encounter: Payer: Self-pay | Admitting: Internal Medicine

## 2020-01-08 ENCOUNTER — Other Ambulatory Visit: Payer: Self-pay

## 2020-01-08 ENCOUNTER — Ambulatory Visit: Payer: Commercial Managed Care - PPO | Admitting: Internal Medicine

## 2020-01-08 VITALS — BP 128/82 | HR 92 | Temp 98.0°F | Wt 269.8 lb

## 2020-01-08 DIAGNOSIS — I10 Essential (primary) hypertension: Secondary | ICD-10-CM

## 2020-01-08 DIAGNOSIS — E119 Type 2 diabetes mellitus without complications: Secondary | ICD-10-CM | POA: Diagnosis not present

## 2020-01-08 DIAGNOSIS — E782 Mixed hyperlipidemia: Secondary | ICD-10-CM | POA: Diagnosis not present

## 2020-01-08 DIAGNOSIS — Z01818 Encounter for other preprocedural examination: Secondary | ICD-10-CM | POA: Diagnosis not present

## 2020-01-08 DIAGNOSIS — G4733 Obstructive sleep apnea (adult) (pediatric): Secondary | ICD-10-CM

## 2020-01-08 NOTE — Assessment & Plan Note (Signed)
Here for preoperative evaluation for knee replacement.  For spinal anesthesia No concerning symptoms to suggest active coronary artery disease or respiratory disease We will check basic blood work today including A1c to make sure sugars are controlled Not on any anticoagulation Okay to hold medication morning of surgery Cleared for surgery as long as blood work looks good

## 2020-01-08 NOTE — Assessment & Plan Note (Signed)
Chronic Check A1c Continue current medications-Jardiance, Januvia, Metformin Once able resume regular exercise [employer] courage compliance with a diabetic diet

## 2020-01-08 NOTE — Assessment & Plan Note (Signed)
Chronic Very mild and does not need CPAP

## 2020-01-08 NOTE — Assessment & Plan Note (Signed)
Chronic Not able to exercise at this time, but once able will encourage regular exercise He has decreased his portions and he has lost some weight-encouraged him to continue these efforts

## 2020-01-08 NOTE — Assessment & Plan Note (Signed)
Chronic Check lipid panel  Continue daily statin Regular exercise and healthy diet encouraged  

## 2020-01-08 NOTE — Assessment & Plan Note (Signed)
Chronic BP well controlled Current regimen effective and well tolerated Continue current medications at current doses cmp  

## 2020-01-09 LAB — COMPLETE METABOLIC PANEL WITH GFR
AG Ratio: 1.7 (calc) (ref 1.0–2.5)
ALT: 15 U/L (ref 9–46)
AST: 12 U/L (ref 10–35)
Albumin: 4.3 g/dL (ref 3.6–5.1)
Alkaline phosphatase (APISO): 77 U/L (ref 35–144)
BUN: 18 mg/dL (ref 7–25)
CO2: 29 mmol/L (ref 20–32)
Calcium: 10.4 mg/dL — ABNORMAL HIGH (ref 8.6–10.3)
Chloride: 100 mmol/L (ref 98–110)
Creat: 1.23 mg/dL (ref 0.70–1.33)
GFR, Est African American: 78 mL/min/{1.73_m2} (ref >=60)
GFR, Est Non African American: 67 mL/min/{1.73_m2} (ref >=60)
Globulin: 2.5 g/dL (calc) (ref 1.9–3.7)
Glucose, Bld: 139 mg/dL — ABNORMAL HIGH (ref 65–99)
Potassium: 5.1 mmol/L (ref 3.5–5.3)
Sodium: 136 mmol/L (ref 135–146)
Total Bilirubin: 0.8 mg/dL (ref 0.2–1.2)
Total Protein: 6.8 g/dL (ref 6.1–8.1)

## 2020-01-09 LAB — HEMOGLOBIN A1C
Hgb A1c MFr Bld: 6.8 % of total Hgb — ABNORMAL HIGH (ref <5.7)
Mean Plasma Glucose: 148 (calc)
eAG (mmol/L): 8.2 (calc)

## 2020-01-09 LAB — LIPID PANEL
Cholesterol: 167 mg/dL (ref <200)
HDL: 32 mg/dL — ABNORMAL LOW (ref >=40)
LDL Cholesterol (Calc): 99 mg/dL (calc)
Non-HDL Cholesterol (Calc): 135 mg/dL (calc) — ABNORMAL HIGH (ref <130)
Total CHOL/HDL Ratio: 5.2 (calc) — ABNORMAL HIGH (ref <5.0)
Triglycerides: 235 mg/dL — ABNORMAL HIGH (ref <150)

## 2020-01-09 LAB — CBC WITH DIFFERENTIAL/PLATELET
Absolute Monocytes: 782 cells/uL (ref 200–950)
Basophils Absolute: 74 cells/uL (ref 0–200)
Basophils Relative: 0.8 %
Eosinophils Absolute: 138 cells/uL (ref 15–500)
Eosinophils Relative: 1.5 %
HCT: 47.6 % (ref 38.5–50.0)
Hemoglobin: 15.8 g/dL (ref 13.2–17.1)
Lymphs Abs: 2843 cells/uL (ref 850–3900)
MCH: 26.2 pg — ABNORMAL LOW (ref 27.0–33.0)
MCHC: 33.2 g/dL (ref 32.0–36.0)
MCV: 78.8 fL — ABNORMAL LOW (ref 80.0–100.0)
MPV: 10.9 fL (ref 7.5–12.5)
Monocytes Relative: 8.5 %
Neutro Abs: 5364 cells/uL (ref 1500–7800)
Neutrophils Relative %: 58.3 %
Platelets: 348 10*3/uL (ref 140–400)
RBC: 6.04 10*6/uL — ABNORMAL HIGH (ref 4.20–5.80)
RDW: 16.2 % — ABNORMAL HIGH (ref 11.0–15.0)
Total Lymphocyte: 30.9 %
WBC: 9.2 10*3/uL (ref 3.8–10.8)

## 2020-01-11 ENCOUNTER — Telehealth: Payer: Self-pay | Admitting: Pulmonary Disease

## 2020-01-11 NOTE — Telephone Encounter (Signed)
[facility] unable to speak with Dr. Tamera Punt or Dr. Jackalyn Lombard coordinator. Left message asking when surgery was and letting them know Dr. Ander Slade isn't back until 01/19/20 , form is on the top of his mail folders in [place]

## 2020-01-16 ENCOUNTER — Other Ambulatory Visit: Payer: Self-pay | Admitting: Orthopedic Surgery

## 2020-01-16 NOTE — Telephone Encounter (Signed)
lmtcb for medical records.

## 2020-01-18 NOTE — Telephone Encounter (Signed)
Will await for Dr. Ander Slade to sign form then call office back.

## 2020-01-20 NOTE — Telephone Encounter (Signed)
Dr. Jenetta Downer, please advise if form has been taken care of.

## 2020-01-22 NOTE — Telephone Encounter (Signed)
I believe I did take care of it   Low risk for surgery from a pulmonary perspective

## 2020-01-22 NOTE — Telephone Encounter (Signed)
Called [facility] to see if they had received fax from our office for surgical clearance and they stated that they had not. Stated to them to refax form and then we would regive it to Dr. Jenetta Downer.

## 2020-01-23 NOTE — Telephone Encounter (Signed)
Benjamin Chen, please advise if this has been received before and if Dr. Ander Slade has completed it. If not, there should be another blank form coming.

## 2020-01-31 NOTE — Telephone Encounter (Signed)
Called [facility] and spoke with Bethena Roys to see if she received surgical clearance form that we faxed back to them on 9/10.Bethena Roys stated that this had been received.nothing further needed.

## 2020-02-16 NOTE — Patient Instructions (Addendum)
DUE TO COVID-19 ONLY ONE VISITOR IS ALLOWED TO COME WITH YOU AND STAY IN THE WAITING ROOM ONLY DURING PRE OP AND PROCEDURE DAY OF SURGERY. THE 1 VISITOR  MAY VISIT WITH YOU AFTER SURGERY IN YOUR PRIVATE ROOM DURING VISITING HOURS ONLY!  YOU NEED TO HAVE A COVID 19 TEST ON: 02/27/20 @ 2:20 PM , THIS TEST MUST BE DONE BEFORE SURGERY,  COVID TESTING SITE [place] [id number], IT IS ON THE RIGHT GOING OUT [address] APPROXIMATELY  2 MINUTES PAST [employer] ON THE RIGHT. ONCE YOUR COVID TEST IS COMPLETED,  PLEASE BEGIN THE QUARANTINE INSTRUCTIONS AS OUTLINED IN YOUR HANDOUT.                Benjamin Chen    Your procedure is scheduled on: 03/01/20   Report to [facility] Main  Entrance   Report to short stay at : 5:30 AM     Call this number if you have problems the morning of surgery [phone]    Remember:   NO SOLID FOOD AFTER MIDNIGHT THE NIGHT PRIOR TO SURGERY. NOTHING BY MOUTH EXCEPT CLEAR LIQUIDS UNTIL: 4:30 AM. PLEASE FINISH GATORADE DRINK PER SURGEON ORDER  WHICH NEEDS TO BE COMPLETED AT: 4:30 AM .  CLEAR LIQUID DIET   Foods Allowed                                                                     Foods Excluded  Coffee and tea, regular and decaf                             liquids that you cannot  Plain Jell-O any favor except red or purple                                           see through such as: Fruit ices (not with fruit pulp)                                     milk, soups, orange juice  Iced Popsicles                                    All solid food Carbonated beverages, regular and diet                                    Cranberry, grape and apple juices Sports drinks like Gatorade Lightly seasoned clear broth or consume(fat free) Sugar, honey syrup  Sample Menu Breakfast                                Lunch  Supper Cranberry juice                    Beef broth                             Chicken broth Jell-O                                     Grape juice                           Apple juice Coffee or tea                        Jell-O                                      Popsicle                                                Coffee or tea                        Coffee or tea  _____________________________________________________________________   BRUSH YOUR TEETH MORNING OF SURGERY AND RINSE YOUR MOUTH OUT, NO CHEWING GUM CANDY OR MINTS.     Take these medicines the morning of surgery with A SIP OF WATER: amlodipine.  How to Manage Your Diabetes Before and After Surgery  Why is it important to control my blood sugar before and after surgery? . Improving blood sugar levels before and after surgery helps healing and can limit problems. . A way of improving blood sugar control is eating a healthy diet by: o  Eating less sugar and carbohydrates o  Increasing activity/exercise o  Talking with your doctor about reaching your blood sugar goals . High blood sugars (greater than 180 mg/dL) can raise your risk of infections and slow your recovery, so you will need to focus on controlling your diabetes during the weeks before surgery. . Make sure that the doctor who takes care of your diabetes knows about your planned surgery including the date and location.  How do I manage my blood sugar before surgery? . Check your blood sugar at least 4 times a day, starting 2 days before surgery, to make sure that the level is not too high or low. o Check your blood sugar the morning of your surgery when you wake up and every 2 hours until you get to the Short Stay unit. . If your blood sugar is less than 70 mg/dL, you will need to treat for low blood sugar: o Do not take insulin. o Treat a low blood sugar (less than 70 mg/dL) with  cup of clear juice (cranberry or apple), 4 glucose tablets, OR glucose gel. o Recheck blood sugar in 15 minutes after treatment (to make sure it is  greater than 70 mg/dL). If your blood sugar is not greater than 70 mg/dL on recheck, call [phone] for further instructions. . Report your blood sugar to the short stay nurse when you get to Short Stay.  . If you  are admitted to the hospital after surgery: o Your blood sugar will be checked by the staff and you will probably be given insulin after surgery (instead of oral diabetes medicines) to make sure you have good blood sugar levels. o The goal for blood sugar control after surgery is 80-180 mg/dL.   WHAT DO I DO ABOUT MY DIABETES MEDICATION?  Marland Kitchen Do not take oral diabetes medicines (pills) the morning of surgery.  . THE DAY BEFORE SURGERY, take Metformin and januvia as usual. DO NOT take jardiance.      . THE MORNING OF SURGERY:  DO NOT TAKE ANY DIABETIC MEDICATIONS DAY OF YOUR SURGERY                               You may not have any metal on your body including hair pins and              piercings  Do not wear jewelry, lotions, powders or perfumes, deodorant             Men may shave face and neck.   Do not bring valuables to the hospital. [place].  Contacts, dentures or bridgework may not be worn into surgery.  Leave suitcase in the car. After surgery it may be brought to your room.     Patients discharged the day of surgery will not be allowed to drive home. IF YOU ARE HAVING SURGERY AND GOING HOME THE SAME DAY, YOU MUST HAVE AN ADULT TO DRIVE YOU HOME AND BE WITH YOU FOR 24 HOURS. YOU MAY GO HOME BY TAXI OR UBER OR ORTHERWISE, BUT AN ADULT MUST ACCOMPANY YOU HOME AND STAY WITH YOU FOR 24 HOURS.  Name and phone number of your driver:  Special Instructions: N/A              Please read over the following fact sheets you were given: _____________________________________________________________________       [facility] - Preparing for Surgery Before surgery, you can play an important role.  Because skin is not sterile,  your skin needs to be as free of germs as possible.  You can reduce the number of germs on your skin by washing with CHG (chlorahexidine gluconate) soap before surgery.  CHG is an antiseptic cleaner which kills germs and bonds with the skin to continue killing germs even after washing. Please DO NOT use if you have an allergy to CHG or antibacterial soaps.  If your skin becomes reddened/irritated stop using the CHG and inform your nurse when you arrive at Short Stay. Do not shave (including legs and underarms) for at least 48 hours prior to the first CHG shower.  You may shave your face/neck. Please follow these instructions carefully:  1.  Shower with CHG Soap the night before surgery and the  morning of Surgery.  2.  If you choose to wash your hair, wash your hair first as usual with your  normal  shampoo.  3.  After you shampoo, rinse your hair and body thoroughly to remove the  shampoo.                           4.  Use CHG as you would any other liquid soap.  You can apply chg directly  to  the skin and wash                       Gently with a scrungie or clean washcloth.  5.  Apply the CHG Soap to your body ONLY FROM THE NECK DOWN.   Do not use on face/ open                           Wound or open sores. Avoid contact with eyes, ears mouth and genitals (private parts).                       Wash face,  Genitals (private parts) with your normal soap.             6.  Wash thoroughly, paying special attention to the area where your surgery  will be performed.  7.  Thoroughly rinse your body with warm water from the neck down.  8.  DO NOT shower/wash with your normal soap after using and rinsing off  the CHG Soap.                9.  Pat yourself dry with a clean towel.            10.  Wear clean pajamas.            11.  Place clean sheets on your bed the night of your first shower and do not  sleep with pets. Day of Surgery : Do not apply any lotions/deodorants the morning of surgery.  Please wear  clean clothes to the hospital/surgery center.  FAILURE TO FOLLOW THESE INSTRUCTIONS MAY RESULT IN THE CANCELLATION OF YOUR SURGERY PATIENT SIGNATURE_________________________________  NURSE SIGNATURE__________________________________  ________________________________________________________________________   Benjamin Chen  An incentive spirometer is a tool that can help keep your lungs clear and active. This tool measures how well you are filling your lungs with each breath. Taking long deep breaths may help reverse or decrease the chance of developing breathing (pulmonary) problems (especially infection) following:  A long period of time when you are unable to move or be active. BEFORE THE PROCEDURE   If the spirometer includes an indicator to show your best effort, your nurse or respiratory therapist will set it to a desired goal.  If possible, sit up straight or lean slightly forward. Try not to slouch.  Hold the incentive spirometer in an upright position. INSTRUCTIONS FOR USE  1. Sit on the edge of your bed if possible, or sit up as far as you can in bed or on a chair. 2. Hold the incentive spirometer in an upright position. 3. Breathe out normally. 4. Place the mouthpiece in your mouth and seal your lips tightly around it. 5. Breathe in slowly and as deeply as possible, raising the piston or the ball toward the top of the column. 6. Hold your breath for 3-5 seconds or for as long as possible. Allow the piston or ball to fall to the bottom of the column. 7. Remove the mouthpiece from your mouth and breathe out normally. 8. Rest for a few seconds and repeat Steps 1 through 7 at least 10 times every 1-2 hours when you are awake. Take your time and take a few normal breaths between deep breaths. 9. The spirometer may include an indicator to show your best effort. Use the indicator as a goal to work toward during each repetition. 10. After each set  of 10 deep breaths, practice  coughing to be sure your lungs are clear. If you have an incision (the cut made at the time of surgery), support your incision when coughing by placing a pillow or rolled up towels firmly against it. Once you are able to get out of bed, walk around indoors and cough well. You may stop using the incentive spirometer when instructed by your caregiver.  RISKS AND COMPLICATIONS  Take your time so you do not get dizzy or light-headed.  If you are in pain, you may need to take or ask for pain medication before doing incentive spirometry. It is harder to take a deep breath if you are having pain. AFTER USE  Rest and breathe slowly and easily.  It can be helpful to keep track of a log of your progress. Your caregiver can provide you with a simple table to help with this. If you are using the spirometer at home, follow these instructions: [place] IF:   You are having difficultly using the spirometer.  You have trouble using the spirometer as often as instructed.  Your pain medication is not giving enough relief while using the spirometer.  You develop fever of 100.5 F (38.1 C) or higher. SEEK IMMEDIATE MEDICAL CARE IF:   You cough up bloody sputum that had not been present before.  You develop fever of 102 F (38.9 C) or greater.  You develop worsening pain at or near the incision site. MAKE SURE YOU:   Understand these instructions.  Will watch your condition.  Will get help right away if you are not doing well or get worse. Document Released: 09/07/2006 Document Revised: 07/20/2011 Document Reviewed: 11/08/2006 [facility] Patient Information 2014 [place], [place].   ________________________________________________________________________

## 2020-02-19 ENCOUNTER — Encounter (HOSPITAL_COMMUNITY)
Admission: RE | Admit: 2020-02-19 | Discharge: 2020-02-19 | Disposition: A | Discharge status: Home / Self Care | Payer: Commercial Managed Care - PPO | Source: Ambulatory Visit | Attending: Orthopedic Surgery | Admitting: Orthopedic Surgery

## 2020-02-19 ENCOUNTER — Ambulatory Visit (HOSPITAL_COMMUNITY)
Admission: RE | Admit: 2020-02-19 | Discharge: 2020-02-19 | Disposition: A | Discharge status: Home / Self Care | Payer: Commercial Managed Care - PPO | Source: Ambulatory Visit | Attending: Orthopedic Surgery | Admitting: Orthopedic Surgery

## 2020-02-19 ENCOUNTER — Encounter (HOSPITAL_COMMUNITY): Payer: Self-pay

## 2020-02-19 ENCOUNTER — Other Ambulatory Visit: Payer: Self-pay

## 2020-02-19 DIAGNOSIS — Z01811 Encounter for preprocedural respiratory examination: Secondary | ICD-10-CM | POA: Diagnosis present

## 2020-02-19 HISTORY — DX: Unspecified osteoarthritis, unspecified site: M19.90

## 2020-02-19 LAB — COMPREHENSIVE METABOLIC PANEL
ALT: 20 U/L (ref 0–44)
AST: 18 U/L (ref 15–41)
Albumin: 3.8 g/dL (ref 3.5–5.0)
Alkaline Phosphatase: 72 U/L (ref 38–126)
Anion gap: 9 (ref 5–15)
BUN: 14 mg/dL (ref 6–20)
CO2: 24 mmol/L (ref 22–32)
Calcium: 9.2 mg/dL (ref 8.9–10.3)
Chloride: 103 mmol/L (ref 98–111)
Creatinine, Ser: 1.16 mg/dL (ref 0.61–1.24)
GFR, Estimated: >60 mL/min (ref >60)
Glucose, Bld: 115 mg/dL — ABNORMAL HIGH (ref 70–99)
Potassium: 4.4 mmol/L (ref 3.5–5.1)
Sodium: 136 mmol/L (ref 135–145)
Total Bilirubin: 1.3 mg/dL — ABNORMAL HIGH (ref 0.3–1.2)
Total Protein: 6.9 g/dL (ref 6.5–8.1)

## 2020-02-19 LAB — CBC WITH DIFFERENTIAL/PLATELET
Abs Immature Granulocytes: 0.04 10*3/uL (ref 0.00–0.07)
Basophils Absolute: 0.1 10*3/uL (ref 0.0–0.1)
Basophils Relative: 1 %
Eosinophils Absolute: 0.1 10*3/uL (ref 0.0–0.5)
Eosinophils Relative: 2 %
HCT: 47 % (ref 39.0–52.0)
Hemoglobin: 16.1 g/dL (ref 13.0–17.0)
Immature Granulocytes: 1 %
Lymphocytes Relative: 29 %
Lymphs Abs: 2.3 10*3/uL (ref 0.7–4.0)
MCH: 26.5 pg (ref 26.0–34.0)
MCHC: 34.3 g/dL (ref 30.0–36.0)
MCV: 77.4 fL — ABNORMAL LOW (ref 80.0–100.0)
Monocytes Absolute: 0.7 10*3/uL (ref 0.1–1.0)
Monocytes Relative: 8 %
Neutro Abs: 5 10*3/uL (ref 1.7–7.7)
Neutrophils Relative %: 59 %
Platelets: 332 10*3/uL (ref 150–400)
RBC: 6.07 MIL/uL — ABNORMAL HIGH (ref 4.22–5.81)
RDW: 15.7 % — ABNORMAL HIGH (ref 11.5–15.5)
WBC: 8.2 10*3/uL (ref 4.0–10.5)
nRBC: 0 % (ref 0.0–0.2)

## 2020-02-19 LAB — URINALYSIS, ROUTINE W REFLEX MICROSCOPIC
Bilirubin Urine: NEGATIVE
Glucose, UA: >=500 mg/dL — AB
Hgb urine dipstick: NEGATIVE
Ketones, ur: NEGATIVE mg/dL
Leukocytes,Ua: NEGATIVE
Nitrite: NEGATIVE
Protein, ur: NEGATIVE mg/dL
Specific Gravity, Urine: 1.028 (ref 1.005–1.030)
pH: 5 (ref 5.0–8.0)

## 2020-02-19 LAB — PROTIME-INR
INR: 0.9 (ref 0.8–1.2)
Prothrombin Time: 12.1 seconds (ref 11.4–15.2)

## 2020-02-19 LAB — SURGICAL PCR SCREEN
MRSA, PCR: NEGATIVE
Staphylococcus aureus: POSITIVE — AB

## 2020-02-19 LAB — TYPE AND SCREEN
ABO/RH(D): O POS
Antibody Screen: NEGATIVE

## 2020-02-19 LAB — GLUCOSE, CAPILLARY: Glucose-Capillary: 111 mg/dL — ABNORMAL HIGH (ref 70–99)

## 2020-02-19 NOTE — Progress Notes (Addendum)
COVID Vaccine Completed:Yes Date COVID Vaccine completed: 08/30/19 COVID vaccine manufacturer: Pfizer     PCP - Dr. Billey Gosling. LOV: 01/08/20 Cardiologist - Dr. Quay Burow.  Chest x-ray - 07/19/19 EKG - 07/12/19 Stress Test -  ECHO - 08/02/19 Cardiac Cath -  Pacemaker/ICD device last checked: A1-C: 6.8: 01/08/20. Sleep Study - Yes CPAP - No  Fasting Blood Sugar - Do not check CBG's Checks Blood Sugar _____ times a day  Blood Thinner Instructions: Aspirin Instructions: Last Dose:  Anesthesia review:   Patient denies shortness of breath, fever, cough and chest pain at PAT appointment   Patient verbalized understanding of instructions that were given to them at the PAT appointment. Patient was also instructed that they will need to review over the PAT instructions again at home before surgery.

## 2020-02-20 NOTE — Progress Notes (Signed)
PCR: Positive STAPH.

## 2020-02-27 ENCOUNTER — Other Ambulatory Visit (HOSPITAL_COMMUNITY)
Admission: RE | Admit: 2020-02-27 | Discharge: 2020-02-27 | Disposition: A | Discharge status: Home / Self Care | Payer: Commercial Managed Care - PPO | Source: Ambulatory Visit | Attending: Orthopedic Surgery | Admitting: Orthopedic Surgery

## 2020-02-27 DIAGNOSIS — Z20822 Contact with and (suspected) exposure to covid-19: Secondary | ICD-10-CM | POA: Insufficient documentation

## 2020-02-27 DIAGNOSIS — Z01812 Encounter for preprocedural laboratory examination: Secondary | ICD-10-CM | POA: Diagnosis present

## 2020-02-27 LAB — SARS CORONAVIRUS 2 (TAT 6-24 HRS): SARS Coronavirus 2: NEGATIVE

## 2020-02-29 ENCOUNTER — Other Ambulatory Visit: Payer: Self-pay | Admitting: Internal Medicine

## 2020-02-29 MED ORDER — VANCOMYCIN HCL 1500 MG/300ML IV SOLN
1500.0000 mg | INTRAVENOUS | Status: AC
  Administered 2020-03-01: 1500 mg via INTRAVENOUS
  Filled 2020-02-29: qty 300

## 2020-02-29 MED ORDER — BUPIVACAINE LIPOSOME 1.3 % IJ SUSP
20.0000 mL | Freq: Once | INTRAMUSCULAR | Status: DC
  Filled 2020-02-29: qty 20

## 2020-02-29 NOTE — Anesthesia Preprocedure Evaluation (Addendum)
Anesthesia Evaluation  Patient identified by MRN, date of birth, ID band Patient awake    Reviewed: Allergy & Precautions, NPO status , Patient's Chart, lab work & pertinent test results  Airway Mallampati: II  TM Distance: >3 FB     Dental   Pulmonary shortness of breath, sleep apnea ,    breath sounds clear to auscultation       Cardiovascular hypertension,  Rhythm:Regular Rate:Normal     Neuro/Psych    GI/Hepatic GERD  ,  Endo/Other  diabetes  Renal/GU      Musculoskeletal   Abdominal   Peds  Hematology   Anesthesia Other Findings   Reproductive/Obstetrics                            Anesthesia Physical Anesthesia Plan  ASA: III  Anesthesia Plan: Spinal   Post-op Pain Management:  Regional for Post-op pain   Induction: Intravenous  PONV Risk Score and Plan: 2 and Ondansetron, Dexamethasone and Midazolam  Airway Management Planned: Nasal Cannula and Simple Face Mask  Additional Equipment:   Intra-op Plan:   Post-operative Plan:   Informed Consent: I have reviewed the patients History and Physical, chart, labs and discussed the procedure including the risks, benefits and alternatives for the proposed anesthesia with the patient or authorized representative who has indicated his/her understanding and acceptance.     Dental advisory given  Plan Discussed with: CRNA and Anesthesiologist  Anesthesia Plan Comments:        Anesthesia Quick Evaluation

## 2020-03-01 ENCOUNTER — Ambulatory Visit (HOSPITAL_COMMUNITY): Payer: Commercial Managed Care - PPO | Admitting: Anesthesiology

## 2020-03-01 ENCOUNTER — Encounter (HOSPITAL_COMMUNITY): Payer: Self-pay | Admitting: Orthopedic Surgery

## 2020-03-01 ENCOUNTER — Ambulatory Visit (HOSPITAL_COMMUNITY)
Admission: RE | Admit: 2020-03-01 | Discharge: 2020-03-01 | Disposition: A | Discharge status: Home / Self Care | Payer: Commercial Managed Care - PPO | Attending: Orthopedic Surgery | Admitting: Orthopedic Surgery

## 2020-03-01 ENCOUNTER — Encounter (HOSPITAL_COMMUNITY)
Admission: RE | Disposition: A | Discharge status: Home / Self Care | Payer: Self-pay | Source: Home / Self Care | Attending: Orthopedic Surgery

## 2020-03-01 DIAGNOSIS — K760 Fatty (change of) liver, not elsewhere classified: Secondary | ICD-10-CM | POA: Diagnosis not present

## 2020-03-01 DIAGNOSIS — Z833 Family history of diabetes mellitus: Secondary | ICD-10-CM | POA: Diagnosis not present

## 2020-03-01 DIAGNOSIS — Z8249 Family history of ischemic heart disease and other diseases of the circulatory system: Secondary | ICD-10-CM | POA: Diagnosis not present

## 2020-03-01 DIAGNOSIS — M1711 Unilateral primary osteoarthritis, right knee: Secondary | ICD-10-CM | POA: Insufficient documentation

## 2020-03-01 DIAGNOSIS — M25761 Osteophyte, right knee: Secondary | ICD-10-CM | POA: Diagnosis not present

## 2020-03-01 DIAGNOSIS — Z96651 Presence of right artificial knee joint: Secondary | ICD-10-CM

## 2020-03-01 DIAGNOSIS — I10 Essential (primary) hypertension: Secondary | ICD-10-CM | POA: Diagnosis not present

## 2020-03-01 DIAGNOSIS — E119 Type 2 diabetes mellitus without complications: Secondary | ICD-10-CM | POA: Diagnosis not present

## 2020-03-01 DIAGNOSIS — M1710 Unilateral primary osteoarthritis, unspecified knee: Secondary | ICD-10-CM

## 2020-03-01 DIAGNOSIS — Z79899 Other long term (current) drug therapy: Secondary | ICD-10-CM | POA: Insufficient documentation

## 2020-03-01 DIAGNOSIS — Z888 Allergy status to other drugs, medicaments and biological substances status: Secondary | ICD-10-CM | POA: Diagnosis not present

## 2020-03-01 DIAGNOSIS — G4733 Obstructive sleep apnea (adult) (pediatric): Secondary | ICD-10-CM | POA: Diagnosis not present

## 2020-03-01 DIAGNOSIS — Z880 Allergy status to penicillin: Secondary | ICD-10-CM | POA: Diagnosis not present

## 2020-03-01 DIAGNOSIS — M179 Osteoarthritis of knee, unspecified: Secondary | ICD-10-CM

## 2020-03-01 HISTORY — PX: TOTAL KNEE ARTHROPLASTY: SHX125

## 2020-03-01 LAB — GLUCOSE, CAPILLARY
Glucose-Capillary: 124 mg/dL — ABNORMAL HIGH (ref 70–99)
Glucose-Capillary: 145 mg/dL — ABNORMAL HIGH (ref 70–99)

## 2020-03-01 LAB — ABO/RH: ABO/RH(D): O POS

## 2020-03-01 SURGERY — ARTHROPLASTY, KNEE, TOTAL
Anesthesia: Spinal | Site: Knee | Laterality: Right

## 2020-03-01 MED ORDER — PROPOFOL 500 MG/50ML IV EMUL
INTRAVENOUS | Status: DC | PRN
  Administered 2020-03-01: 100 ug/kg/min via INTRAVENOUS

## 2020-03-01 MED ORDER — TRANEXAMIC ACID-NACL 1000-0.7 MG/100ML-% IV SOLN
INTRAVENOUS | Status: AC
  Administered 2020-03-01: 1000 mg
  Filled 2020-03-01: qty 100

## 2020-03-01 MED ORDER — MIDAZOLAM HCL 2 MG/2ML IJ SOLN
INTRAMUSCULAR | Status: AC
  Filled 2020-03-01: qty 2

## 2020-03-01 MED ORDER — WATER FOR IRRIGATION, STERILE IR SOLN
Status: DC | PRN
  Administered 2020-03-01: 2000 mL

## 2020-03-01 MED ORDER — CEFAZOLIN SODIUM-DEXTROSE 1-4 GM/50ML-% IV SOLN
INTRAVENOUS | Status: AC
  Filled 2020-03-01: qty 50

## 2020-03-01 MED ORDER — OXYCODONE HCL 5 MG PO TABS
ORAL_TABLET | ORAL | Status: AC
  Filled 2020-03-01: qty 1

## 2020-03-01 MED ORDER — VANCOMYCIN HCL IN DEXTROSE 1-5 GM/200ML-% IV SOLN: 1000.0000 mg | Freq: Two times a day (BID) | INTRAVENOUS | Status: DC

## 2020-03-01 MED ORDER — FENTANYL CITRATE (PF) 100 MCG/2ML IJ SOLN
INTRAMUSCULAR | Status: DC | PRN
  Administered 2020-03-01 (×2): 50 ug via INTRAVENOUS

## 2020-03-01 MED ORDER — BUPIVACAINE IN DEXTROSE 0.75-8.25 % IT SOLN
INTRATHECAL | Status: DC | PRN
  Administered 2020-03-01: 1.6 mL via INTRATHECAL

## 2020-03-01 MED ORDER — BUPIVACAINE LIPOSOME 1.3 % IJ SUSP
INTRAMUSCULAR | Status: DC | PRN
  Administered 2020-03-01: 20 mL

## 2020-03-01 MED ORDER — TRANEXAMIC ACID-NACL 1000-0.7 MG/100ML-% IV SOLN: 1000.0000 mg | Freq: Once | INTRAVENOUS | Status: DC

## 2020-03-01 MED ORDER — POVIDONE-IODINE 10 % EX SWAB
2.0000 "application " | Freq: Once | CUTANEOUS | Status: AC
  Administered 2020-03-01: 2 via TOPICAL

## 2020-03-01 MED ORDER — BUPIVACAINE-EPINEPHRINE 0.5% -1:200000 IJ SOLN
INTRAMUSCULAR | Status: DC | PRN
  Administered 2020-03-01: 30 mL

## 2020-03-01 MED ORDER — FENTANYL CITRATE (PF) 100 MCG/2ML IJ SOLN
INTRAMUSCULAR | Status: AC
  Filled 2020-03-01: qty 2

## 2020-03-01 MED ORDER — PROPOFOL 10 MG/ML IV BOLUS
INTRAVENOUS | Status: AC
  Filled 2020-03-01: qty 20

## 2020-03-01 MED ORDER — METHOCARBAMOL 500 MG IVPB - SIMPLE MED: 500.0000 mg | Freq: Four times a day (QID) | INTRAVENOUS | Status: DC | PRN

## 2020-03-01 MED ORDER — SODIUM CHLORIDE 0.9 % IR SOLN
Status: DC | PRN
  Administered 2020-03-01: 3000 mL

## 2020-03-01 MED ORDER — HYDROMORPHONE HCL 1 MG/ML IJ SOLN
0.2500 mg | INTRAMUSCULAR | Status: DC | PRN
  Administered 2020-03-01: 0.5 mg via INTRAVENOUS

## 2020-03-01 MED ORDER — HYDROMORPHONE HCL 1 MG/ML IJ SOLN
INTRAMUSCULAR | Status: AC
  Administered 2020-03-01: 0.5 mg via INTRAVENOUS
  Filled 2020-03-01: qty 1

## 2020-03-01 MED ORDER — LACTATED RINGERS IV SOLN: INTRAVENOUS | Status: DC

## 2020-03-01 MED ORDER — CEFAZOLIN SODIUM-DEXTROSE 2-4 GM/100ML-% IV SOLN
2.0000 g | Freq: Once | INTRAVENOUS | Status: AC
  Administered 2020-03-01: 2 g via INTRAVENOUS

## 2020-03-01 MED ORDER — PROPOFOL 500 MG/50ML IV EMUL
INTRAVENOUS | Status: AC
  Filled 2020-03-01: qty 50

## 2020-03-01 MED ORDER — CHLORHEXIDINE GLUCONATE 0.12 % MT SOLN
15.0000 mL | Freq: Once | OROMUCOSAL | Status: AC
  Administered 2020-03-01: 15 mL via OROMUCOSAL

## 2020-03-01 MED ORDER — SODIUM CHLORIDE 0.9% FLUSH
INTRAVENOUS | Status: DC | PRN
  Administered 2020-03-01: 50 mL

## 2020-03-01 MED ORDER — DEXAMETHASONE SODIUM PHOSPHATE 10 MG/ML IJ SOLN
INTRAMUSCULAR | Status: AC
  Filled 2020-03-01: qty 1

## 2020-03-01 MED ORDER — SODIUM CHLORIDE (PF) 0.9 % IJ SOLN
INTRAMUSCULAR | Status: AC
  Filled 2020-03-01: qty 50

## 2020-03-01 MED ORDER — ONDANSETRON HCL 4 MG/2ML IJ SOLN
INTRAMUSCULAR | Status: AC
  Filled 2020-03-01: qty 2

## 2020-03-01 MED ORDER — BUPIVACAINE-EPINEPHRINE 0.5% -1:200000 IJ SOLN
INTRAMUSCULAR | Status: AC
  Filled 2020-03-01: qty 1

## 2020-03-01 MED ORDER — TRANEXAMIC ACID-NACL 1000-0.7 MG/100ML-% IV SOLN
1000.0000 mg | INTRAVENOUS | Status: AC
  Administered 2020-03-01: 1000 mg via INTRAVENOUS
  Filled 2020-03-01: qty 100

## 2020-03-01 MED ORDER — METHOCARBAMOL 500 MG IVPB - SIMPLE MED
INTRAVENOUS | Status: AC
  Administered 2020-03-01: 500 mg via INTRAVENOUS
  Filled 2020-03-01: qty 50

## 2020-03-01 MED ORDER — MIDAZOLAM HCL 5 MG/5ML IJ SOLN
INTRAMUSCULAR | Status: DC | PRN
  Administered 2020-03-01: 2 mg via INTRAVENOUS

## 2020-03-01 MED ORDER — 0.9 % SODIUM CHLORIDE (POUR BTL) OPTIME
TOPICAL | Status: DC | PRN
  Administered 2020-03-01: 1000 mL

## 2020-03-01 MED ORDER — CEFAZOLIN SODIUM-DEXTROSE 2-4 GM/100ML-% IV SOLN
INTRAVENOUS | Status: AC
  Filled 2020-03-01: qty 100

## 2020-03-01 MED ORDER — METHOCARBAMOL 500 MG PO TABS: 500.0000 mg | ORAL_TABLET | Freq: Four times a day (QID) | ORAL | Status: DC | PRN

## 2020-03-01 MED ORDER — ORAL CARE MOUTH RINSE: 15.0000 mL | Freq: Once | OROMUCOSAL | Status: AC

## 2020-03-01 MED ORDER — VANCOMYCIN HCL IN DEXTROSE 1-5 GM/200ML-% IV SOLN: 1000.0000 mg | Freq: Once | INTRAVENOUS | Status: DC

## 2020-03-01 SURGICAL SUPPLY — 54 items
APL SKNCLS STERI-STRIP NONHPOA (GAUZE/BANDAGES/DRESSINGS) ×1
ATTUNE MED DOME PAT 41 KNEE (Knees) ×2 IMPLANT
ATTUNE PS FEM RT SZ 6 CEM KNEE (Femur) ×2 IMPLANT
ATTUNE PSRP INSR SZ6 8 KNEE (Insert) ×2 IMPLANT
BAG SPEC THK2 15X12 ZIP CLS (MISCELLANEOUS) ×1
BAG ZIPLOCK 12X15 (MISCELLANEOUS) ×2 IMPLANT
BASE TIBIA ATTUNE KNEE SYS SZ6 (Knees) ×1 IMPLANT
BENZOIN TINCTURE PRP APPL 2/3 (GAUZE/BANDAGES/DRESSINGS) ×2 IMPLANT
BLADE SAGITTAL 25.0X1.19X90 (BLADE) ×2 IMPLANT
BLADE SAW SGTL 13.0X1.19X90.0M (BLADE) ×2 IMPLANT
BLADE SURG SZ10 CARB STEEL (BLADE) ×4 IMPLANT
BNDG ELASTIC 6X5.8 VLCR STR LF (GAUZE/BANDAGES/DRESSINGS) ×2 IMPLANT
BOOTIES KNEE HIGH SLOAN (MISCELLANEOUS) ×2 IMPLANT
BOWL SMART MIX CTS (DISPOSABLE) ×2 IMPLANT
BSPLAT TIB 6 CMNT ROT PLAT STR (Knees) ×1 IMPLANT
CEMENT HV SMART SET (Cement) ×4 IMPLANT
CLSR STERI-STRIP ANTIMIC 1/2X4 (GAUZE/BANDAGES/DRESSINGS) ×4 IMPLANT
COVER SURGICAL LIGHT HANDLE (MISCELLANEOUS) ×2 IMPLANT
COVER WAND RF STERILE (DRAPES) IMPLANT
CUFF TOURN SGL QUICK 34 (TOURNIQUET CUFF) ×2
CUFF TRNQT CYL 34X4.125X (TOURNIQUET CUFF) ×1 IMPLANT
DECANTER SPIKE VIAL GLASS SM (MISCELLANEOUS) ×4 IMPLANT
DRAPE U-SHAPE 47X51 STRL (DRAPES) ×2 IMPLANT
DRSG AQUACEL AG ADV 3.5X10 (GAUZE/BANDAGES/DRESSINGS) ×2 IMPLANT
DURAPREP 26ML APPLICATOR (WOUND CARE) ×2 IMPLANT
ELECT REM PT RETURN 15FT ADLT (MISCELLANEOUS) ×2 IMPLANT
GLOVE BIOGEL PI IND STRL 8 (GLOVE) ×2 IMPLANT
GLOVE BIOGEL PI INDICATOR 8 (GLOVE) ×2
GLOVE ECLIPSE 7.5 STRL STRAW (GLOVE) ×4 IMPLANT
GOWN STRL REUS W/TWL XL LVL3 (GOWN DISPOSABLE) ×4 IMPLANT
HANDPIECE INTERPULSE COAX TIP (DISPOSABLE) ×2
HOLDER FOLEY CATH W/STRAP (MISCELLANEOUS) IMPLANT
HOOD PEEL AWAY FLYTE STAYCOOL (MISCELLANEOUS) ×6 IMPLANT
KIT TURNOVER KIT A (KITS) IMPLANT
MANIFOLD NEPTUNE II (INSTRUMENTS) ×2 IMPLANT
NEEDLE HYPO 22GX1.5 SAFETY (NEEDLE) ×2 IMPLANT
NS IRRIG 1000ML POUR BTL (IV SOLUTION) ×2 IMPLANT
PACK ICE MAXI GEL EZY WRAP (MISCELLANEOUS) ×2 IMPLANT
PACK TOTAL KNEE CUSTOM (KITS) ×2 IMPLANT
PADDING CAST COTTON 6X4 STRL (CAST SUPPLIES) ×2 IMPLANT
PENCIL SMOKE EVACUATOR (MISCELLANEOUS) IMPLANT
PIN DRILL FIX HALF THREAD (BIT) ×2 IMPLANT
PIN STEINMAN FIXATION KNEE (PIN) ×2 IMPLANT
PROTECTOR NERVE ULNAR (MISCELLANEOUS) ×2 IMPLANT
SET HNDPC FAN SPRY TIP SCT (DISPOSABLE) ×1 IMPLANT
STRIP CLOSURE SKIN 1/2X4 (GAUZE/BANDAGES/DRESSINGS) IMPLANT
SUT MNCRL AB 3-0 PS2 18 (SUTURE) ×2 IMPLANT
SUT VIC AB 0 CT1 36 (SUTURE) ×2 IMPLANT
SUT VIC AB 1 CT1 36 (SUTURE) ×4 IMPLANT
SYR CONTROL 10ML LL (SYRINGE) ×4 IMPLANT
TIBIA ATTUNE KNEE SYS BASE SZ6 (Knees) ×2 IMPLANT
TRAY FOLEY MTR SLVR 16FR STAT (SET/KITS/TRAYS/PACK) ×2 IMPLANT
WATER STERILE IRR 1000ML POUR (IV SOLUTION) ×4 IMPLANT
WRAP KNEE MAXI GEL POST OP (GAUZE/BANDAGES/DRESSINGS) ×2 IMPLANT

## 2020-03-01 NOTE — Evaluation (Signed)
Physical Therapy Evaluation Patient Details Name: Benjamin Chen MRN: [id number] DOB: Nov 05, 1965 Today's Date: 03/01/2020   History of Present Illness  Pt s/p R TKR  Clinical Impression  Pt s/p R TKR and presents with decreased R LE strength/ROM and post op pain limiting functional mobility.  Pt should progress to dc home with family assist.  Pt reports HHPT will be following him at home.    Follow Up Recommendations Follow surgeon's recommendation for DC plan and follow-up therapies;Home health PT    Equipment Recommendations  None recommended by PT    Recommendations for Other Services       Precautions / Restrictions Precautions Precautions: Knee;Fall Restrictions Weight Bearing Restrictions: No      Mobility  Bed Mobility Overal bed mobility: Needs Assistance Bed Mobility: Supine to Sit     Supine to sit: Min guard     General bed mobility comments: safety    Transfers Overall transfer level: Needs assistance Equipment used: Rolling walker (2 wheeled) Transfers: Sit to/from Stand Sit to Stand: Min assist;Min guard;Supervision         General transfer comment: cues for LE management and use of UEs to self assist.  Performed x 5 from various level surfaces  Ambulation/Gait Ambulation/Gait assistance: Min guard;Supervision Gait Distance (Feet): 100 Feet Assistive device: Rolling walker (2 wheeled) Gait Pattern/deviations: Step-to pattern;Decreased step length - right;Decreased step length - left;Shuffle Gait velocity: decr   General Gait Details: cues for posture, sequence and position from [employer]            Wheelchair Mobility    Modified Rankin (Stroke Patients Only)       Balance Overall balance assessment: Needs assistance Sitting-balance support: No upper extremity supported;Feet supported Sitting balance-Leahy Scale: Good     Standing balance support: Bilateral upper extremity supported Standing balance-Leahy Scale:  Poor Standing balance comment: reliant on UEs for support                             Pertinent Vitals/Pain Pain Assessment: 0-10 Pain Score: 2  Pain Location: R knee Pain Descriptors / Indicators: Sore Pain Intervention(s): Limited activity within patient's tolerance;Monitored during session;Premedicated before session;Ice applied    Home Living Family/patient expects to be discharged to:: Private residence Living Arrangements: Spouse/significant other Available Help at Discharge: Family Type of Home: Apartment Home Access: Level entry     Home Layout: One level Home Equipment: Environmental consultant - 2 wheels;Cane - single point      Prior Function Level of Independence: Independent               Hand Dominance        Extremity/Trunk Assessment   Upper Extremity Assessment Upper Extremity Assessment: Overall WFL for tasks assessed    Lower Extremity Assessment Lower Extremity Assessment: RLE deficits/detail RLE Deficits / Details: 3/5 quads with IND SLR; AAROM at knee -5 - 50    Cervical / Trunk Assessment Cervical / Trunk Assessment: Normal  Communication   Communication: No difficulties  Cognition Arousal/Alertness: Awake/alert Behavior During Therapy: WFL for tasks assessed/performed Overall Cognitive Status: Within Functional Limits for tasks assessed                                        General Comments      Exercises Total Joint Exercises Ankle Circles/Pumps: AROM;Both;15 reps;Supine Quad  Sets: AROM;Both;10 reps;Supine Heel Slides: AAROM;Right;10 reps;Supine Straight Leg Raises: AAROM;AROM;Right;15 reps;Supine   Assessment/Plan    PT Assessment Patient needs continued PT services  PT Problem List Decreased strength;Decreased range of motion;Decreased activity tolerance;Decreased balance;Decreased knowledge of use of DME;Pain       PT Treatment Interventions DME instruction;Gait training;Stair training;Functional mobility  training;Therapeutic activities;Therapeutic exercise;Patient/family education    PT Goals (Current goals can be found in the Care Plan section)  Acute Rehab PT Goals Patient Stated Goal: Regain IND PT Goal Formulation: With patient Time For Goal Achievement: 03/08/20 Potential to Achieve Goals: Good    Frequency 7X/week   Barriers to discharge        Co-evaluation               AM-PAC PT "6 Clicks" Mobility  Outcome Measure Help needed turning from your back to your side while in a flat bed without using bedrails?: A Little Help needed moving from lying on your back to sitting on the side of a flat bed without using bedrails?: A Little Help needed moving to and from a bed to a chair (including a wheelchair)?: A Little Help needed standing up from a chair using your arms (e.g., wheelchair or bedside chair)?: A Little Help needed to walk in hospital room?: A Little Help needed climbing 3-5 steps with a railing? : A Little 6 Click Score: 18    End of Session Equipment Utilized During Treatment: Gait belt Activity Tolerance: Patient tolerated treatment well Patient left: in chair;with call bell/phone within reach Nurse Communication: Mobility status PT Visit Diagnosis: Difficulty in walking, not elsewhere classified (R26.2)    Time: 1323-1405 PT Time Calculation (min) (ACUTE ONLY): 42 min   Charges:   PT Evaluation $PT Eval Low Complexity: 1 Low PT Treatments $Gait Training: 8-22 mins $Therapeutic Exercise: 8-22 mins        Debe Coder PT Acute Rehabilitation Services Pager [phone] Office [phone]   Benjamin Chen 03/01/2020, 2:17 PM

## 2020-03-01 NOTE — H&P (Signed)
TOTAL KNEE ADMISSION H&P  Patient is being admitted for right total knee arthroplasty.  Subjective:  Chief Complaint:right knee pain.  HPI: Benjamin Chen, 54 y.o. male, has a history of pain and functional disability in the right knee due to arthritis and has failed non-surgical conservative treatments for greater than 12 weeks to includeNSAID's and/or analgesics, corticosteriod injections, viscosupplementation injections and activity modification.  Onset of symptoms was gradual, starting 5 years ago with gradually worsening course since that time. The patient noted no past surgery on the right knee(s).  Patient currently rates pain in the right knee(s) at 9 out of 10 with activity. Patient has night pain, worsening of pain with activity and weight bearing, pain that interferes with activities of daily living, pain with passive range of motion and joint swelling.  Patient has evidence of subchondral sclerosis, periarticular osteophytes, joint subluxation and joint space narrowing by imaging studies. This patient has had Failure of all reasonable conservative care. There is no active infection.  Patient Active Problem List   Diagnosis Date Noted  . Primary osteoarthritis of right knee 03/01/2020  . Status post total knee replacement, right 03/01/2020  . Preop examination 01/08/2020  . Shortness of breath 07/03/2019  . Palpitations 07/03/2019  . GERD (gastroesophageal reflux disease) 07/03/2019  . Low testosterone in male 01/18/2019  . Fatty liver 03/27/2018  . Cholelithiasis 03/27/2018  . Fatigue 10/13/2017  . Obesity, morbid ([place]) 01/12/2017  . Chest pain 10/07/2016  . Erectile dysfunction 10/07/2016  . Gout 01/29/2016  . Arthralgia 01/24/2016  . Hyperlipidemia 01/24/2016  . Essential hypertension, benign 12/30/2015  . Diabetes ([place]) 12/30/2015  . OSA (obstructive sleep apnea) 12/30/2015  . Bilateral primary osteoarthritis of knee 12/30/2015  . Joint pain 12/30/2015   Past Medical  History:  Diagnosis Date  . Allergy    seasonal  . Arthritis   . Diabetes mellitus without complication ([place])   . GERD (gastroesophageal reflux disease)    occasional heartburn/no meds  . Gout   . Hyperlipidemia   . Hypertension   . Sleep apnea    Do not use it.    Past Surgical History:  Procedure Laterality Date  . KNEE ARTHROSCOPY Right    right - torn meniscus  . KNEE ARTHROSCOPY Left    left - torn meniscus    Current Facility-Administered Medications  Medication Dose Route Frequency Provider Last Rate Last Admin  . bupivacaine liposome (EXPAREL) 1.3 % injection 266 mg  20 mL Other Once Dorna Leitz, MD      . lactated ringers infusion   Intravenous Continuous Suzette Battiest, MD 10 mL/hr at 03/01/20 0630 Continued from Pre-op at 03/01/20 0630  . tranexamic acid (CYKLOKAPRON) IVPB 1,000 mg  1,000 mg Intravenous To OR Nevaeh Korte, MD      . vancomycin (VANCOREADY) IVPB 1500 mg/300 mL  1,500 mg Intravenous On Call to OR Dorna Leitz, MD 150 mL/hr at 03/01/20 0621 1,500 mg at 03/01/20 1610   Facility-Administered Medications Ordered in Other Encounters  Medication Dose Route Frequency Provider Last Rate Last Admin  . fentaNYL (SUBLIMAZE) injection   Intravenous Anesthesia Intra-op Williford, Peggy D, CRNA   50 mcg at 03/01/20 0701  . midazolam (VERSED) 5 MG/5ML injection   Intravenous Anesthesia Intra-op Williford, Peggy D, CRNA   2 mg at 03/01/20 0701   Allergies  Allergen Reactions  . Allopurinol Palpitations    Fatigue  . Penicillins Swelling    Pt  unsure about how much or where the swelling was.  Social History   Tobacco Use  . Smoking status: Never Smoker  . Smokeless tobacco: Never Used  Substance Use Topics  . Alcohol use: Yes    Alcohol/week: 2.0 standard drinks    Types: 2 Standard drinks or equivalent per week    Family History  Problem Relation Age of Onset  . Hyperlipidemia Mother   . Hypertension Mother   . Mental illness Father   . Heart  disease Maternal Aunt   . Diabetes Maternal Grandmother   . Heart disease Sister   . Hypertension Sister   . Thyroid disease Brother   . Colon cancer Neg Hx   . Esophageal cancer Neg Hx      Review of Systems ROS: I have reviewed the patient's review of systems thoroughly and there are no positive responses as relates to the HPI. Objective:  Physical Exam  Vital signs in last 24 hours: Temp:  [98 F (36.7 C)] 98 F (36.7 C) (10/22 0606) Pulse Rate:  [80] 80 (10/22 0606) Resp:  [17] 17 (10/22 0606) BP: (148)/(91) 148/91 (10/22 0606) SpO2:  [99 %] 99 % (10/22 0606) Well-developed well-nourished patient in no acute distress. Alert and oriented x3 HEENT:within normal limits Cardiac: Regular rate and rhythm Pulmonary: Lungs clear to auscultation Abdomen: Soft and nontender.  Normal active bowel sounds  Musculoskeletal: (Right knee: Painful range of motion. Limited range of motion. No instability. Trace effusion. Labs: Recent Results (from the past 2160 hour(s))  Hemoglobin A1c     Status: Abnormal   Collection Time: 01/08/20  2:51 PM  Result Value Ref Range   Hgb A1c MFr Bld 6.8 (H) <5.7 % of total Hgb    Comment: For someone without known diabetes, a hemoglobin A1c value of 6.5% or greater indicates that they may have  diabetes and this should be confirmed with a follow-up  test. . For someone with known diabetes, a value <7% indicates  that their diabetes is well controlled and a value  greater than or equal to 7% indicates suboptimal  control. A1c targets should be individualized based on  duration of diabetes, age, comorbid conditions, and  other considerations. . Currently, no consensus exists regarding use of hemoglobin A1c for diagnosis of diabetes for children. .    Mean Plasma Glucose 148 (calc)   eAG (mmol/L) 8.2 (calc)  Lipid panel     Status: Abnormal   Collection Time: 01/08/20  2:51 PM  Result Value Ref Range   Cholesterol 167 <200 mg/dL   HDL 32  (L) > OR = 40 mg/dL   Triglycerides 235 (H) <150 mg/dL    Comment: . If a non-fasting specimen was collected, consider repeat triglyceride testing on a fasting specimen if clinically indicated.  Yates Decamp et al. J. of Clin. Lipidol. 9622;2:979-892. Marland Kitchen    LDL Cholesterol (Calc) 99 mg/dL (calc)    Comment: Reference range: <100 . Desirable range <100 mg/dL for primary prevention;   <70 mg/dL for patients with CHD or diabetic patients  with > or = 2 CHD risk factors. Marland Kitchen LDL-C is now calculated using the Martin-Hopkins  calculation, which is a validated novel method providing  better accuracy than the Friedewald equation in the  estimation of LDL-C.  Cresenciano Genre et al. Annamaria Helling. 1194;174(08): 2061-2068  (http://education.Quest[employer].com/faq/FAQ164)    Total CHOL/HDL Ratio 5.2 (H) <5.0 (calc)   Non-HDL Cholesterol (Calc) 135 (H) <130 mg/dL (calc)    Comment: For patients with diabetes plus 1 major ASCVD risk  factor, treating to a  non-HDL-C goal of <100 mg/dL  (LDL-C of <70 mg/dL) is considered a therapeutic  option.   COMPLETE METABOLIC PANEL WITH GFR     Status: Abnormal   Collection Time: 01/08/20  2:51 PM  Result Value Ref Range   Glucose, Bld 139 (H) 65 - 99 mg/dL    Comment: .            Fasting reference interval . For someone without known diabetes, a glucose value >125 mg/dL indicates that they may have diabetes and this should be confirmed with a follow-up test. .    BUN 18 7 - 25 mg/dL   Creat 1.23 0.70 - 1.33 mg/dL    Comment: For patients >22 years of age, the reference limit for Creatinine is approximately 13% higher for people identified as African-American. .    GFR, Est Non African American 67 > OR = 60 mL/min/1.72m2   GFR, Est African American 78 > OR = 60 mL/min/1.10m2   BUN/Creatinine Ratio NOT APPLICABLE 6 - 22 (calc)   Sodium 136 135 - 146 mmol/L   Potassium 5.1 3.5 - 5.3 mmol/L   Chloride 100 98 - 110 mmol/L   CO2 29 20 - 32 mmol/L   Calcium 10.4  (H) 8.6 - 10.3 mg/dL   Total Protein 6.8 6.1 - 8.1 g/dL   Albumin 4.3 3.6 - 5.1 g/dL   Globulin 2.5 1.9 - 3.7 g/dL (calc)   AG Ratio 1.7 1.0 - 2.5 (calc)   Total Bilirubin 0.8 0.2 - 1.2 mg/dL   Alkaline phosphatase (APISO) 77 35 - 144 U/L   AST 12 10 - 35 U/L   ALT 15 9 - 46 U/L  CBC with Differential/Platelet     Status: Abnormal   Collection Time: 01/08/20  2:51 PM  Result Value Ref Range   WBC 9.2 3.8 - 10.8 Thousand/uL   RBC 6.04 (H) 4.20 - 5.80 Million/uL   Hemoglobin 15.8 13.2 - 17.1 g/dL   HCT 47.6 38 - 50 %   MCV 78.8 (L) 80.0 - 100.0 fL   MCH 26.2 (L) 27.0 - 33.0 pg   MCHC 33.2 32.0 - 36.0 g/dL   RDW 16.2 (H) 11.0 - 15.0 %   Platelets 348 140 - 400 Thousand/uL   MPV 10.9 7.5 - 12.5 fL   Neutro Abs 5,364 1,500 - 7,800 cells/uL   Lymphs Abs 2,843 850 - 3,900 cells/uL   Absolute Monocytes 782 200 - 950 cells/uL   Eosinophils Absolute 138 15.0 - 500.0 cells/uL   Basophils Absolute 74 0.0 - 200.0 cells/uL   Neutrophils Relative % 58.3 %   Total Lymphocyte 30.9 %   Monocytes Relative 8.5 %   Eosinophils Relative 1.5 %   Basophils Relative 0.8 %  Glucose, capillary     Status: Abnormal   Collection Time: 02/19/20  8:44 AM  Result Value Ref Range   Glucose-Capillary 111 (H) 70 - 99 mg/dL    Comment: Glucose reference range applies only to samples taken after fasting for at least 8 hours.  Surgical pcr screen     Status: Abnormal   Collection Time: 02/19/20  9:02 AM   Specimen: Nasal Mucosa; Nasal Swab  Result Value Ref Range   MRSA, PCR NEGATIVE NEGATIVE   Staphylococcus aureus POSITIVE (A) NEGATIVE    Comment: (NOTE) The Xpert SA Assay (FDA approved for NASAL specimens in patients 41 years of age and older), is one component of a comprehensive surveillance program. It is not intended to diagnose  infection nor to guide or monitor treatment. Performed at [facility], [place] [address], [place], [place] [address]   CBC WITH DIFFERENTIAL     Status:  Abnormal   Collection Time: 02/19/20  9:02 AM  Result Value Ref Range   WBC 8.2 4.0 - 10.5 K/uL   RBC 6.07 (H) 4.22 - 5.81 MIL/uL   Hemoglobin 16.1 13.0 - 17.0 g/dL   HCT 47.0 39 - 52 %   MCV 77.4 (L) 80.0 - 100.0 fL   MCH 26.5 26.0 - 34.0 pg   MCHC 34.3 30.0 - 36.0 g/dL   RDW 15.7 (H) 11.5 - 15.5 %   Platelets 332 150 - 400 K/uL   nRBC 0.0 0.0 - 0.2 %   Neutrophils Relative % 59 %   Neutro Abs 5.0 1.7 - 7.7 K/uL   Lymphocytes Relative 29 %   Lymphs Abs 2.3 0.7 - 4.0 K/uL   Monocytes Relative 8 %   Monocytes Absolute 0.7 0.1 - 1.0 K/uL   Eosinophils Relative 2 %   Eosinophils Absolute 0.1 0.0 - 0.5 K/uL   Basophils Relative 1 %   Basophils Absolute 0.1 0.0 - 0.1 K/uL   Immature Granulocytes 1 %   Abs Immature Granulocytes 0.04 0.00 - 0.07 K/uL    Comment: Performed at [facility], [place] [address] [place], [place] [address]  Comprehensive metabolic panel     Status: Abnormal   Collection Time: 02/19/20  9:02 AM  Result Value Ref Range   Sodium 136 135 - 145 mmol/L   Potassium 4.4 3.5 - 5.1 mmol/L   Chloride 103 98 - 111 mmol/L   CO2 24 22 - 32 mmol/L   Glucose, Bld 115 (H) 70 - 99 mg/dL    Comment: Glucose reference range applies only to samples taken after fasting for at least 8 hours.   BUN 14 6 - 20 mg/dL   Creatinine, Ser 1.16 0.61 - 1.24 mg/dL   Calcium 9.2 8.9 - 10.3 mg/dL   Total Protein 6.9 6.5 - 8.1 g/dL   Albumin 3.8 3.5 - 5.0 g/dL   AST 18 15 - 41 U/L   ALT 20 0 - 44 U/L   Alkaline Phosphatase 72 38 - 126 U/L   Total Bilirubin 1.3 (H) 0.3 - 1.2 mg/dL   GFR, Estimated >60 >60 mL/min   Anion gap 9 5 - 15    Comment: Performed at [facility], [place] [address] [place], [place] [address]  Protime-INR     Status: None   Collection Time: 02/19/20  9:02 AM  Result Value Ref Range   Prothrombin Time 12.1 11.4 - 15.2 seconds   INR 0.9 0.8 - 1.2    Comment: (NOTE) INR goal varies based on device and disease states. Performed  at [facility], [place] [address] [place], [place] [address]   Type and screen Order type and screen if day of surgery is less than 15 days from draw of preadmission visit or order morning of surgery if day of surgery is greater than 6 days from preadmission visit.     Status: None   Collection Time: 02/19/20  9:02 AM  Result Value Ref Range   ABO/RH(D) O POS    Antibody Screen NEG    Sample Expiration 03/04/2020,2359    Extend sample reason      NO TRANSFUSIONS OR PREGNANCY IN THE PAST 3 MONTHS Performed at [facility], [place] [address] [place], [place] [address]   Urinalysis, Routine w reflex microscopic Urine, Clean Catch     Status: Abnormal   Collection Time: 02/19/20  9:02 AM  Result Value Ref Range   Color, Urine YELLOW YELLOW   APPearance CLEAR CLEAR   Specific Gravity, Urine 1.028 1.005 - 1.030   pH 5.0 5.0 - 8.0   Glucose, UA >=500 (A) NEGATIVE mg/dL   Hgb urine dipstick NEGATIVE NEGATIVE   Bilirubin Urine NEGATIVE NEGATIVE   Ketones, ur NEGATIVE NEGATIVE mg/dL   Protein, ur NEGATIVE NEGATIVE mg/dL   Nitrite NEGATIVE NEGATIVE   Leukocytes,Ua NEGATIVE NEGATIVE   RBC / HPF 0-5 0 - 5 RBC/hpf   WBC, UA 0-5 0 - 5 WBC/hpf   Bacteria, UA RARE (A) NONE SEEN   Squamous Epithelial / LPF 0-5 0 - 5   Mucus PRESENT     Comment: Performed at [facility], [place] [address] [place], [place] [address]  SARS CORONAVIRUS 2 (TAT 6-24 HRS) Nasopharyngeal Nasopharyngeal Swab     Status: None   Collection Time: 02/27/20  3:17 PM   Specimen: Nasopharyngeal Swab  Result Value Ref Range   SARS Coronavirus 2 NEGATIVE NEGATIVE    Comment: (NOTE) SARS-CoV-2 target nucleic acids are NOT DETECTED.  The SARS-CoV-2 RNA is generally detectable in upper and lower respiratory specimens during the acute phase of infection. Negative results do not preclude SARS-CoV-2 infection, do not rule out co-infections with other pathogens, and should not be  used as the sole basis for treatment or other patient management decisions. Negative results must be combined with clinical observations, patient history, and epidemiological information. The expected result is Negative.  Fact Sheet for Patients: SugarRoll.be  Fact Sheet for Healthcare Providers: https://www.woods-mathews.com/  This test is not yet approved or cleared by the [place] FDA and  has been authorized for detection and/or diagnosis of SARS-CoV-2 by FDA under an Emergency Use Authorization (EUA). This EUA will remain  in effect (meaning this test can be used) for the duration of the COVID-19 declaration under Se ction 564(b)(1) of the Act, 21 U.S.C. section 360bbb-3(b)(1), unless the authorization is terminated or revoked sooner.  Performed at [facility], [place] [address], [place], [place] [address]   Glucose, capillary     Status: Abnormal   Collection Time: 03/01/20  6:04 AM  Result Value Ref Range   Glucose-Capillary 124 (H) 70 - 99 mg/dL    Comment: Glucose reference range applies only to samples taken after fasting for at least 8 hours.   Comment 1 Notify RN    Comment 2 Document in Chart   ABO/Rh     Status: None   Collection Time: 03/01/20  6:15 AM  Result Value Ref Range   ABO/RH(D)      O POS Performed at [facility], [place] [address] [place],  [address]     Estimated body mass index is 36.83 kg/m as calculated from the following:   Height as of 02/19/20: 6' (1.829 m).   Weight as of 02/19/20: 123.2 kg.   Imaging Review Plain radiographs demonstrate severe degenerative joint disease of the right knee(s). The overall alignment ismild varus. The bone quality appears to be fair for age and reported activity level.      Assessment/Plan:  End stage arthritis, right knee   The patient history, physical examination, clinical judgment of the provider and imaging studies are  consistent with end stage degenerative joint disease of the right  knee(s) and total knee arthroplasty is deemed medically necessary. The treatment options including medical management, injection therapy arthroscopy and arthroplasty were discussed at length. The risks and benefits of total knee arthroplasty were presented and reviewed. The risks due to aseptic loosening, infection, stiffness, patella tracking problems, thromboembolic complications and other imponderables were discussed. The patient acknowledged the explanation, agreed to proceed with the plan and consent was signed. Patient is being admitted for inpatient treatment for surgery, pain control, PT, OT, prophylactic antibiotics, VTE prophylaxis, progressive ambulation and ADL's and discharge planning. The patient is planning to be discharged home with home health services

## 2020-03-01 NOTE — Discharge Summary (Signed)
Physician Discharge Summary  Patient ID: Kyrus Hyde MRN: [id number] DOB/AGE: [id number] 53 y.o.  Admit date: 03/01/2020 Discharge date: 03/01/2020  Admission Diagnoses:  Discharge Diagnoses:  Principal Problem:   Status post total knee replacement, right Active Problems:   Primary osteoarthritis of right knee   Discharged Condition: good  Hospital Course: same day surgery   Consults: None   Significant Diagnostic Studies: None  Treatments: Ancef and TXA post-operatively in the PACU  Discharge Exam: Blood pressure (!) 138/95, pulse 78, temperature 97.6 F (36.4 C), resp. rate 16, SpO2 100 %. General appearance: alert, cooperative, appears stated age and no distress Head: Normocephalic, without obvious abnormality, atraumatic Eyes: negative findings: lids and lashes normal and corneas clear Neck: supple, symmetrical, trachea midline Resp: No retractions Extremities: extremities normal, atraumatic, no cyanosis or edema Pulses: Symmetric lower extremity pulses Neurologic: Grossly normal Incision/Wound: C/D/I  Disposition: Discharge disposition: 01-Home or Self Care       Discharge Instructions    Call MD for:  difficulty breathing, headache or visual disturbances   Complete by: As directed    Call MD for:  persistant nausea and vomiting   Complete by: As directed    Call MD for:  severe uncontrolled pain   Complete by: As directed    Call MD for:  temperature >100.4   Complete by: As directed    Diet - low sodium heart healthy   Complete by: As directed    Discharge instructions   Complete by: As directed    -You have an Ace bandage on your surgical leg.  Keep this in place for 2 days.  You may remove it on day 3 after surgery. -You have a waterproof bandage on your Ace wrap.  Please keep this in place until we see you back in clinic in 2 weeks. -Do not apply lotions, creams or powders on the bandage. -You may bear weight on her surgical leg beginning  today. -You should elevate and ice your knee several times a day to help with swelling and pain. -Use your CPM machine as instructed.   Driving Restrictions   Complete by: As directed    It would not be safe for you to drive a vehicle until we see you in clinic in 2 weeks.   Increase activity slowly   Complete by: As directed    Leave dressing on - Keep it clean, dry, and intact until clinic visit   Complete by: As directed      Allergies as of 03/01/2020      Reactions   Allopurinol Palpitations   Fatigue   Penicillins Swelling   Pt  unsure about how much or where the swelling was.      Medication List    TAKE these medications   amLODipine 5 MG tablet Commonly known as: NORVASC TAKE 1 TABLET BY MOUTH EVERY DAY   Januvia 100 MG tablet Generic drug: sitaGLIPtin TAKE 1 TABLET BY MOUTH EVERY DAY What changed: how much to take   Jardiance 10 MG Tabs tablet Generic drug: empagliflozin Take 10 mg by mouth daily.   metFORMIN 750 MG 24 hr tablet Commonly known as: GLUCOPHAGE-XR TAKE 2 TABLETS (1,500 MG TOTAL) BY MOUTH DAILY WITH BREAKFAST.   pravastatin 10 MG tablet Commonly known as: PRAVACHOL TAKE 1 TABLET BY MOUTH EVERY DAY   quinapril 20 MG tablet Commonly known as: ACCUPRIL TAKE 1 TABLET BY MOUTH TWICE A DAY What changed: when to take this   spironolactone 25 MG tablet  Commonly known as: ALDACTONE TAKE 1 TABLET BY MOUTH EVERY DAY            Durable Medical Equipment  (From admission, onward)         Start     Ordered   03/01/20 1144  DME 3-in-1  Once        03/01/20 1145           Discharge Care Instructions  (From admission, onward)         Start     Ordered   03/01/20 0000  Leave dressing on - Keep it clean, dry, and intact until clinic visit        03/01/20 1145          Follow-up Information    Dorna Leitz, MD.   Specialty: Orthopedic Surgery Why: Follow up in clinic in 2 weeks for post-operative evaluation. Contact  information: [place] [address] [phone]               Signed: Dereck Leep 03/01/2020, 1:46 PM

## 2020-03-01 NOTE — Transfer of Care (Signed)
Immediate Anesthesia Transfer of Care Note  Patient: Benjamin Chen  Procedure(s) Performed: TOTAL KNEE ARTHROPLASTY (Right Knee)  Patient Location: PACU  Anesthesia Type:Regional and Spinal  Level of Consciousness: awake, alert  and oriented  Airway & Oxygen Therapy: Patient Spontanous Breathing and Patient connected to face mask oxygen  Post-op Assessment: Report given to RN and Post -op Vital signs reviewed and stable  Post vital signs: Reviewed and stable  Last Vitals:  Vitals Value Taken Time  BP 121/85 03/01/20 0954  Temp    Pulse 78 03/01/20 0955  Resp 19 03/01/20 0955  SpO2 100 % 03/01/20 0955  Vitals shown include unvalidated device data.  Last Pain:  Vitals:   03/01/20 0606  TempSrc: Oral         Complications: No complications documented.

## 2020-03-01 NOTE — Anesthesia Procedure Notes (Signed)
Anesthesia Regional Block: Adductor canal block   Pre-Anesthetic Checklist: ,, timeout performed, Correct Patient, Correct Site, Correct Laterality, Correct Procedure, Correct Position, site marked, Risks and benefits discussed,  Surgical consent,  Pre-op evaluation,  At surgeon's request and post-op pain management  Laterality: Right  Prep: chloraprep       Needles:  Injection technique: Single-shot  Needle Type: Stimulator Needle - 80          Additional Needles:   Procedures: Doppler guided,,,, ultrasound used (permanent image in chart),,,,  Narrative:  Start time: 03/01/2020 6:55 AM End time: 03/01/2020 7:10 AM Injection made incrementally with aspirations every 5 mL.  Performed by: Personally  Anesthesiologist: Belinda Block, MD

## 2020-03-01 NOTE — Anesthesia Procedure Notes (Signed)
Spinal  Patient location during procedure: OR End time: 03/01/2020 7:40 AM Staffing Performed: anesthesiologist and resident/CRNA  Resident/CRNA: Tallie Hevia D, CRNA Preanesthetic Checklist Completed: patient identified, IV checked, site marked, risks and benefits discussed, surgical consent, monitors and equipment checked, pre-op evaluation and timeout performed Spinal Block Patient position: sitting Prep: DuraPrep Patient monitoring: heart rate, continuous pulse ox and blood pressure Approach: midline Location: L3-4 Injection technique: single-shot Needle Needle type: Sprotte  Needle gauge: 24 G Needle length: 9 cm Assessment Sensory level: T6 Additional Notes Expiration date of kit checked and confirmed. Patient tolerated procedure well, without complications.

## 2020-03-01 NOTE — Anesthesia Postprocedure Evaluation (Signed)
Anesthesia Post Note  Patient: Benjamin Chen  Procedure(s) Performed: TOTAL KNEE ARTHROPLASTY (Right Knee)     Patient location during evaluation: PACU Anesthesia Type: Spinal Level of consciousness: awake Pain management: pain level controlled Vital Signs Assessment: post-procedure vital signs reviewed and stable Respiratory status: spontaneous breathing Cardiovascular status: stable Postop Assessment: able to ambulate Anesthetic complications: no   No complications documented.  Last Vitals:  Vitals:   03/01/20 1105 03/01/20 1200  BP: 120/82 (!) 138/95  Pulse: 69 78  Resp: 16 16  Temp: 36.4 C   SpO2: 97% 100%    Last Pain:  Vitals:   03/01/20 1105  TempSrc:   PainSc: 1                  Makendra Vigeant

## 2020-03-01 NOTE — Discharge Instructions (Signed)
-  You have an Ace bandage on your surgical leg.  Keep this in place for 2 days.  You may remove it on day 3 after surgery. -You have a waterproof bandage on your Ace wrap.  Please keep this in place until we see you back in clinic in 2 weeks. -Do not apply lotions, creams or powders on the bandage. -You may bear weight on her surgical leg beginning today. -You should elevate and ice your knee several times a day to help with swelling and pain. -Use your CPM machine as instructed.

## 2020-03-01 NOTE — Op Note (Signed)
PATIENT ID:      Benjamin Chen  MRN:     [id number] DOB/AGE:    1966/04/14 / 54 y.o.       OPERATIVE REPORT   DATE OF PROCEDURE:  03/01/2020      PREOPERATIVE DIAGNOSIS:   RIGHT KNEE OSTEOARTHRITIS      Estimated body mass index is 36.83 kg/m as calculated from the following:   Height as of 02/19/20: 6' (1.829 m).   Weight as of 02/19/20: 123.2 kg.                                                       POSTOPERATIVE DIAGNOSIS:   Same                                                                  PROCEDURE:  Procedure(s): TOTAL KNEE ARTHROPLASTY Using DepuyAttune RP implants #6 Femur, #6Tibia, 8 mm Attune RP bearing, 41 Patella    SURGEON: Alta Corning  ASSISTANT:   Harmon Dun PA-C   (Present and scrubbed throughout the case, critical for assistance with exposure, retraction, instrumentation, and closure.)        ANESTHESIA: spinal, 20cc Exparel, 50cc 0.25% Marcaine EBL: min cc FLUID REPLACEMENT: unk cc crystaloid TOURNIQUET: DRAINS: None TRANEXAMIC ACID: 1gm IV, 2gm topical COMPLICATIONS:  None         INDICATIONS FOR PROCEDURE: The patient has  RIGHT KNEE OSTEOARTHRITIS, mild varus deformities, XR shows bone on bone arthritis, lateral subluxation of tibia. Patient has failed all conservative measures including anti-inflammatory medicines, narcotics, attempts at exercise and weight loss, cortisone injections and viscosupplementation.  Risks and benefits of surgery have been discussed, questions answered.   DESCRIPTION OF PROCEDURE: The patient identified by armband, received  IV antibiotics, in the holding area at [facility]. Patient taken to the operating room, appropriate anesthetic monitors were attached, and spinal anesthesia was  induced. IV Tranexamic acid was given.Tourniquet applied high to the operative thigh. Lateral post and foot positioner applied to the table, the lower extremity was then prepped and draped in usual sterile fashion from the toes to the  tourniquet. Time-out procedure was performed.Harmon Dun [facility], was present and scrubbed throughout the case, critical for assistance with, positioning, exposure, retraction, instrumentation, and closure.The skin and subcutaneous tissue along the incision was injected with 20 cc of a mixture of Exparel and Marcaine solution, using a 20-gauge by 1-1/2 inch needle. We began the operation, with the knee flexed 130 degrees, by making the anterior midline incision starting at handbreadth above the patella going over the patella 1 cm medial to and 4 cm distal to the tibial tubercle. Small bleeders in the skin and the subcutaneous tissue identified and cauterized. Transverse retinaculum was incised and reflected medially and a medial parapatellar arthrotomy was accomplished. the patella was everted and theprepatellar fat pad resected. The superficial medial collateral ligament was then elevated from anterior to posterior along the proximal flare of the tibia and anterior half of the menisci resected. The knee was hyperflexed exposing bone on bone arthritis. Peripheral and notch osteophytes as well as the  cruciate ligaments were then resected. We continued to work our way around posteriorly along the proximal tibia, and externally rotated the tibia subluxing it out from underneath the femur. A McHale PCL retractor was placed through the notch and a lateral Hohmann retractor placed, and we then entered the proximal tibia in line with the Depuy starter drill in line with the axis of the tibia followed by an intramedullary guide rod and 0-degree posterior slope cutting guide. The tibial cutting guide, 4 degree posterior sloped, was pinned into place allowing resection of 2 mm of bone medially and 10 mm of bone laterally. Satisfied with the tibial resection, we then entered the distal femur 2 mm anterior to the PCL origin with the intramedullary guide rod and applied the distal femoral cutting guide set at 9 mm, with 5 degrees  of valgus. This was pinned along the epicondylar axis. At this point, the distal femoral cut was accomplished without difficulty. We then sized for a #6 femoral component and pinned the guide in 3 degrees of external rotation. The chamfer cutting guide was pinned into place. The anterior, posterior, and chamfer cuts were accomplished without difficulty followed by the Attune RP box cutting guide and the box cut. We also removed posterior osteophytes from the posterior femoral condyles. The posterior capsule was injected with Exparel solution. The knee was brought into full extension. We checked our extension gap and fit a 8 mm bearing. Distracting in extension with a lamina spreader,  bleeders in the posterior capsule, Posterior medial and posterior lateral gutter were cauterized.  The transexamic acid-soaked sponge was then placed in the gap of the knee in extension. The knee was flexed 30. The posterior patella cut was accomplished with the 9.5 mm Attune cutting guide, sized for a 62mm dome, and the fixation pegs drilled.The knee was then once again hyperflexed exposing the proximal tibia. We sized for a # 6 tibial base plate, applied the smokestack and the conical reamer followed by the the Delta fin keel punch. We then hammered into place the Attune RP trial femoral component, drilled the lugs, inserted a  8 mm trial bearing, trial patellar button, and took the knee through range of motion from 0-130 degrees. Medial and lateral ligamentous stability was checked. No thumb pressure was required for patellar Tracking. The tourniquet was @45  min. All trial components were removed, mating surfaces irrigated with pulse lavage, and dried with suction and sponges. 10 cc of the Exparel solution was applied to the cancellus bone of the patella distal femur and proximal tibia.  After waiting 30 seconds, the bony surfaces were again, dried with sponges. A double batch of DePuy HV cement was mixed and applied to all bony  metallic mating surfaces except for the posterior condyles of the femur itself. In order, we hammered into place the tibial tray and removed excess cement, the femoral component and removed excess cement. The final Attune RP bearing was inserted, and the knee brought to full extension with compression. The patellar button was clamped into place, and excess cement removed. The knee was held at 30 flexion with compression, while the cement cured. The wound was irrigated out with normal saline solution pulse lavage. The rest of the Exparel was injected into the parapatellar arthrotomy, subcutaneous tissues, and periosteal tissues. The parapatellar arthrotomy was closed with running #1 Vicryl suture. The subcutaneous tissue with 3-0 undyed Vicryl suture, and the skin with running 3-0 SQ vicryl. An Aquacil and Ace wrap were applied. The patient was  taken to recovery room without difficulty.   Alta Corning 03/01/2020, 10:27 AM

## 2020-03-04 ENCOUNTER — Encounter (HOSPITAL_COMMUNITY): Payer: Self-pay | Admitting: Orthopedic Surgery

## 2020-03-30 ENCOUNTER — Other Ambulatory Visit: Payer: Self-pay | Admitting: Internal Medicine

## 2020-05-11 ENCOUNTER — Other Ambulatory Visit: Payer: Self-pay | Admitting: Internal Medicine

## 2020-05-16 ENCOUNTER — Ambulatory Visit
Admission: EM | Admit: 2020-05-16 | Discharge: 2020-05-16 | Disposition: A | Discharge status: Home / Self Care | Payer: Commercial Managed Care - PPO | Attending: Family Medicine | Admitting: Family Medicine

## 2020-05-16 ENCOUNTER — Other Ambulatory Visit: Payer: Self-pay

## 2020-05-16 DIAGNOSIS — J069 Acute upper respiratory infection, unspecified: Secondary | ICD-10-CM

## 2020-05-16 DIAGNOSIS — Z20822 Contact with and (suspected) exposure to covid-19: Secondary | ICD-10-CM | POA: Diagnosis not present

## 2020-05-16 LAB — SARS CORONAVIRUS 2 (TAT 6-24 HRS): SARS Coronavirus 2: NEGATIVE

## 2020-05-16 NOTE — Discharge Instructions (Signed)
Result will be available in the next 24 hours. Check mychart.   Stay home.  Rest, fluids.  Take care  Dr. Adriana Simas

## 2020-05-16 NOTE — ED Triage Notes (Signed)
Patient complains of headache with recent travel out of the country and in [place]. Reports that he would like to be tested for covid.

## 2020-05-16 NOTE — ED Provider Notes (Signed)
[facility]    CSN: [id number] Arrival date & time: 05/16/20  0804  History   Chief Complaint Chief Complaint  Patient presents with  . Headache   HPI  55 year old male presents with headache and runny nose.  Patient reports that he has had runny nose since last week.  Headache over the past 2 days.  Pain 2/10 in severity.  Patient reports recent travel to [place] and [place].  Desires testing today.  No fever.  No other associated symptoms.  No other complaints.  Past Medical History:  Diagnosis Date  . Allergy    seasonal  . Arthritis   . Diabetes mellitus without complication ([place])   . GERD (gastroesophageal reflux disease)    occasional heartburn/no meds  . Gout   . Hyperlipidemia   . Hypertension   . Sleep apnea    Do not use it.    Patient Active Problem List   Diagnosis Date Noted  . Primary osteoarthritis of right knee 03/01/2020  . Status post total knee replacement, right 03/01/2020  . Preop examination 01/08/2020  . Shortness of breath 07/03/2019  . Palpitations 07/03/2019  . GERD (gastroesophageal reflux disease) 07/03/2019  . Low testosterone in male 01/18/2019  . Fatty liver 03/27/2018  . Cholelithiasis 03/27/2018  . Fatigue 10/13/2017  . Obesity, morbid ([place]) 01/12/2017  . Chest pain 10/07/2016  . Erectile dysfunction 10/07/2016  . Gout 01/29/2016  . Arthralgia 01/24/2016  . Hyperlipidemia 01/24/2016  . Essential hypertension, benign 12/30/2015  . Diabetes ([place]) 12/30/2015  . OSA (obstructive sleep apnea) 12/30/2015  . Bilateral primary osteoarthritis of knee 12/30/2015  . Joint pain 12/30/2015    Past Surgical History:  Procedure Laterality Date  . KNEE ARTHROSCOPY Right    right - torn meniscus  . KNEE ARTHROSCOPY Left    left - torn meniscus  . TOTAL KNEE ARTHROPLASTY Right 03/01/2020   Procedure: TOTAL KNEE ARTHROPLASTY;  Surgeon: Dorna Leitz, MD;  Location: [facility];  Service: Orthopedics;  Laterality: Right;        Home Medications    Prior to Admission medications   Medication Sig Start Date End Date Taking? Authorizing Provider  amLODipine (NORVASC) 5 MG tablet TAKE 1 TABLET BY MOUTH EVERY DAY 02/29/20  Yes Burns, Claudina Lick, MD  JANUVIA 100 MG tablet TAKE 1 TABLET BY MOUTH EVERY DAY 05/13/20  Yes Burns, Claudina Lick, MD  JARDIANCE 10 MG TABS tablet TAKE 1 TABLET DAILY 04/01/20  Yes Binnie Rail, MD  metFORMIN (GLUCOPHAGE-XR) 750 MG 24 hr tablet TAKE 2 TABLETS (1,500 MG TOTAL) BY MOUTH DAILY WITH BREAKFAST. 11/14/19  Yes Burns, Claudina Lick, MD  pravastatin (PRAVACHOL) 10 MG tablet TAKE 1 TABLET BY MOUTH EVERY DAY 05/13/20  Yes Burns, Claudina Lick, MD  quinapril (ACCUPRIL) 20 MG tablet TAKE 1 TABLET BY MOUTH TWICE A DAY Patient taking differently: Take 20 mg by mouth in the morning and at bedtime. 11/09/19  Yes Burns, Claudina Lick, MD  spironolactone (ALDACTONE) 25 MG tablet TAKE 1 TABLET BY MOUTH EVERY DAY Patient taking differently: Take 25 mg by mouth daily. 12/11/19  Yes Binnie Rail, MD    Family History Family History  Problem Relation Age of Onset  . Hyperlipidemia Mother   . Hypertension Mother   . Mental illness Father   . Heart disease Maternal Aunt   . Diabetes Maternal Grandmother   . Heart disease Sister   . Hypertension Sister   . Thyroid disease Brother   .  Colon cancer Neg Hx   . Esophageal cancer Neg Hx     Social History Social History   Tobacco Use  . Smoking status: Never Smoker  . Smokeless tobacco: Never Used  Vaping Use  . Vaping Use: Never used  Substance Use Topics  . Alcohol use: Yes    Alcohol/week: 2.0 standard drinks    Types: 2 Standard drinks or equivalent per week  . Drug use: No     Allergies   Allopurinol and Penicillins   Review of Systems Review of Systems  HENT: Positive for rhinorrhea.   Neurological: Positive for headaches.   Physical Exam Triage Vital Signs ED Triage Vitals  Enc Vitals Group     BP 05/16/20 0824 132/89     Pulse Rate 05/16/20  0824 80     Resp 05/16/20 0824 18     Temp 05/16/20 0824 98.2 F (36.8 C)     Temp Source 05/16/20 0824 Oral     SpO2 05/16/20 0824 100 %     Weight 05/16/20 0821 250 lb (113.4 kg)     Height 05/16/20 0821 6' (1.829 m)     Head Circumference --      Peak Flow --      Pain Score 05/16/20 0821 2     Pain Loc --      Pain Edu? --      Excl. in [place]? --    Updated Vital Signs BP 132/89 (BP Location: Right Arm)   Pulse 80   Temp 98.2 F (36.8 C) (Oral)   Resp 18   Ht 6' (1.829 m)   Wt 113.4 kg   SpO2 100%   BMI 33.91 kg/m   Visual Acuity Right Eye Distance:   Left Eye Distance:   Bilateral Distance:    Right Eye Near:   Left Eye Near:    Bilateral Near:     Physical Exam Vitals and nursing note reviewed.  Constitutional:      General: He is not in acute distress.    Appearance: Normal appearance. He is not ill-appearing.  HENT:     Head: Normocephalic and atraumatic.  Eyes:     General:        Right eye: No discharge.        Left eye: No discharge.     Conjunctiva/sclera: Conjunctivae normal.  Cardiovascular:     Rate and Rhythm: Normal rate and regular rhythm.  Pulmonary:     Effort: Pulmonary effort is normal.     Breath sounds: Normal breath sounds. No wheezing, rhonchi or rales.  Neurological:     Mental Status: He is alert.  Psychiatric:        Mood and Affect: Mood normal.        Behavior: Behavior normal.    UC Treatments / Results  Labs (all labs ordered are listed, but only abnormal results are displayed) Labs Reviewed  SARS CORONAVIRUS 2 (TAT 6-24 HRS)    EKG   Radiology No results found.  Procedures Procedures (including critical care time)  Medications Ordered in UC Medications - No data to display  Initial Impression / Assessment and Plan / UC Course  I have reviewed the triage vital signs and the nursing notes.  Pertinent labs & imaging results that were available during my care of the patient were reviewed by me and considered  in my medical decision making (see chart for details).    55 year old male presents with headache and  runny nose.  Awaiting Covid test results.  Supportive care.  Final Clinical Impressions(s) / UC Diagnoses   Final diagnoses:  Upper respiratory tract infection, unspecified type     Discharge Instructions     Result will be available in the next 24 hours. Check mychart.   Stay home.  Rest, fluids.  Take care  Dr. Lacinda Axon    ED Prescriptions    None     PDMP not reviewed this encounter.   Thersa [place], [place] 05/16/20 [phone]

## 2020-05-25 ENCOUNTER — Other Ambulatory Visit: Payer: Self-pay

## 2020-05-25 ENCOUNTER — Ambulatory Visit
Admission: EM | Admit: 2020-05-25 | Discharge: 2020-05-25 | Disposition: A | Discharge status: Home / Self Care | Payer: Commercial Managed Care - PPO | Attending: Family Medicine | Admitting: Family Medicine

## 2020-05-25 DIAGNOSIS — J069 Acute upper respiratory infection, unspecified: Secondary | ICD-10-CM | POA: Diagnosis present

## 2020-05-25 DIAGNOSIS — Z20822 Contact with and (suspected) exposure to covid-19: Secondary | ICD-10-CM | POA: Insufficient documentation

## 2020-05-25 LAB — RAPID INFLUENZA A&B ANTIGENS
Influenza A (ARMC): NEGATIVE
Influenza B (ARMC): NEGATIVE

## 2020-05-25 LAB — GROUP A STREP BY PCR: Group A Strep by PCR: NOT DETECTED

## 2020-05-25 NOTE — ED Triage Notes (Signed)
Pt is here with a sore throat, bodyaches and a headache that started last night, pt has not taken any meds to relieve discomfort.

## 2020-05-25 NOTE — ED Provider Notes (Signed)
[facility]    CSN: [id number] Arrival date & time: 05/25/20  0808      History   Chief Complaint Chief Complaint  Patient presents with  . BODYACHES  . Sore Throat    HPI Benjamin Chen is a 55 y.o. male.   HPI   55 year old male here for evaluation of sore throat, body aches, and headache that started last night.  Patient also reports that he has had a mild runny nose and intermittent shortness of breath.  Patient denies fever, nasal congestion, ear pain, cough, wheezing, nausea, vomiting, or diarrhea.  Patient denies any known COVID exposure.  Patient has had his COVID-vaccine plus his booster shot.  He is unsure if he had his flu shot or not.  Past Medical History:  Diagnosis Date  . Allergy    seasonal  . Arthritis   . Diabetes mellitus without complication ([place])   . GERD (gastroesophageal reflux disease)    occasional heartburn/no meds  . Gout   . Hyperlipidemia   . Hypertension   . Sleep apnea    Do not use it.    Patient Active Problem List   Diagnosis Date Noted  . Primary osteoarthritis of right knee 03/01/2020  . Status post total knee replacement, right 03/01/2020  . Preop examination 01/08/2020  . Shortness of breath 07/03/2019  . Palpitations 07/03/2019  . GERD (gastroesophageal reflux disease) 07/03/2019  . Low testosterone in male 01/18/2019  . Fatty liver 03/27/2018  . Cholelithiasis 03/27/2018  . Fatigue 10/13/2017  . Obesity, morbid ([place]) 01/12/2017  . Chest pain 10/07/2016  . Erectile dysfunction 10/07/2016  . Gout 01/29/2016  . Arthralgia 01/24/2016  . Hyperlipidemia 01/24/2016  . Essential hypertension, benign 12/30/2015  . Diabetes ([place]) 12/30/2015  . OSA (obstructive sleep apnea) 12/30/2015  . Bilateral primary osteoarthritis of knee 12/30/2015  . Joint pain 12/30/2015    Past Surgical History:  Procedure Laterality Date  . KNEE ARTHROSCOPY Right    right - torn meniscus  . KNEE ARTHROSCOPY Left    left - torn  meniscus  . TOTAL KNEE ARTHROPLASTY Right 03/01/2020   Procedure: TOTAL KNEE ARTHROPLASTY;  Surgeon: Dorna Leitz, MD;  Location: [facility];  Service: Orthopedics;  Laterality: Right;       Home Medications    Prior to Admission medications   Medication Sig Start Date End Date Taking? Authorizing Provider  amLODipine (NORVASC) 5 MG tablet TAKE 1 TABLET BY MOUTH EVERY DAY 02/29/20   Burns, Claudina Lick, MD  JANUVIA 100 MG tablet TAKE 1 TABLET BY MOUTH EVERY DAY 05/13/20   Binnie Rail, MD  JARDIANCE 10 MG TABS tablet TAKE 1 TABLET DAILY 04/01/20   Binnie Rail, MD  metFORMIN (GLUCOPHAGE-XR) 750 MG 24 hr tablet TAKE 2 TABLETS (1,500 MG TOTAL) BY MOUTH DAILY WITH BREAKFAST. 11/14/19   Burns, Claudina Lick, MD  pravastatin (PRAVACHOL) 10 MG tablet TAKE 1 TABLET BY MOUTH EVERY DAY 05/13/20   Binnie Rail, MD  quinapril (ACCUPRIL) 20 MG tablet TAKE 1 TABLET BY MOUTH TWICE A DAY Patient taking differently: Take 20 mg by mouth in the morning and at bedtime. 11/09/19   Binnie Rail, MD  spironolactone (ALDACTONE) 25 MG tablet TAKE 1 TABLET BY MOUTH EVERY DAY Patient taking differently: Take 25 mg by mouth daily. 12/11/19   Binnie Rail, MD    Family History Family History  Problem Relation Age of Onset  . Hyperlipidemia Mother   . Hypertension Mother   .  Mental illness Father   . Heart disease Maternal Aunt   . Diabetes Maternal Grandmother   . Heart disease Sister   . Hypertension Sister   . Thyroid disease Brother   . Colon cancer Neg Hx   . Esophageal cancer Neg Hx     Social History Social History   Tobacco Use  . Smoking status: Never Smoker  . Smokeless tobacco: Never Used  Vaping Use  . Vaping Use: Never used  Substance Use Topics  . Alcohol use: Yes    Alcohol/week: 2.0 standard drinks    Types: 2 Standard drinks or equivalent per week  . Drug use: No     Allergies   Allopurinol and Penicillins   Review of Systems Review of Systems  Constitutional: Negative for activity  change, appetite change and fever.  HENT: Positive for rhinorrhea and sore throat. Negative for congestion and ear pain.   Respiratory: Positive for cough and shortness of breath. Negative for wheezing.   Gastrointestinal: Negative for diarrhea, nausea and vomiting.  Musculoskeletal: Positive for arthralgias and myalgias.  Skin: Negative for rash.  Neurological: Positive for headaches.  Hematological: Negative.   Psychiatric/Behavioral: Negative.      Physical Exam Triage Vital Signs ED Triage Vitals  Enc Vitals Group     BP 05/25/20 0815 (!) 129/95     Pulse Rate 05/25/20 0815 87     Resp 05/25/20 0815 18     Temp 05/25/20 0815 98.1 F (36.7 C)     Temp Source 05/25/20 0815 Oral     SpO2 05/25/20 0815 100 %     Weight --      Height --      Head Circumference --      Peak Flow --      Pain Score 05/25/20 0813 0     Pain Loc --      Pain Edu? --      Excl. in [place]? --    No data found.  Updated Vital Signs BP (!) 129/95 (BP Location: Right Arm)   Pulse 87   Temp 98.1 F (36.7 C) (Oral)   Resp 18   SpO2 100%   Visual Acuity Right Eye Distance:   Left Eye Distance:   Bilateral Distance:    Right Eye Near:   Left Eye Near:    Bilateral Near:     Physical Exam Vitals and nursing note reviewed.  Constitutional:      General: He is not in acute distress.    Appearance: Normal appearance. He is normal weight. He is not toxic-appearing.  HENT:     Head: Normocephalic and atraumatic.     Right Ear: Tympanic membrane, ear canal and external ear normal.     Left Ear: Tympanic membrane, ear canal and external ear normal.     Nose: Congestion and rhinorrhea present.     Comments: Nasal mucosa is erythematous and edematous with scant clear nasal discharge.    Mouth/Throat:     Mouth: Mucous membranes are moist.     Pharynx: Oropharynx is clear. Posterior oropharyngeal erythema present. No oropharyngeal exudate.     Comments: Tonsillar pillars are erythematous with 2+  edema.  No exudate noted. Cardiovascular:     Rate and Rhythm: Normal rate and regular rhythm.     Pulses: Normal pulses.     Heart sounds: Normal heart sounds. No murmur heard. No gallop.   Pulmonary:     Effort: Pulmonary effort is normal.  Breath sounds: Normal breath sounds. No wheezing, rhonchi or rales.  Musculoskeletal:     Cervical back: Normal range of motion and neck supple.  Lymphadenopathy:     Cervical: No cervical adenopathy.  Skin:    General: Skin is warm and dry.     Capillary Refill: Capillary refill takes less than 2 seconds.     Findings: No erythema or rash.  Neurological:     General: No focal deficit present.     Mental Status: He is alert and oriented to person, place, and time.  Psychiatric:        Mood and Affect: Mood normal.        Thought Content: Thought content normal.        Judgment: Judgment normal.      UC Treatments / Results  Labs (all labs ordered are listed, but only abnormal results are displayed) Labs Reviewed  GROUP A STREP BY PCR  RAPID INFLUENZA A&B ANTIGENS  SARS CORONAVIRUS 2 (TAT 6-24 HRS)    EKG   Radiology No results found.  Procedures Procedures (including critical care time)  Medications Ordered in UC Medications - No data to display  Initial Impression / Assessment and Plan / UC Course  I have reviewed the triage vital signs and the nursing notes.  Pertinent labs & imaging results that were available during my care of the patient were reviewed by me and considered in my medical decision making (see chart for details).   Patient is a very healthy-appearing 55 year old male who is here for evaluation of sore throat, body aches, and headache that started last night.  Physical exam reveals some erythematous and edematous nasal mucosa with scant clear nasal discharge bilaterally.  Posterior oropharynx does have erythematous and edematous tonsillar pillars without exudate.  No cervical lymphadenopathy appreciated.   Lungs are clear to auscultation.  Patient has been fully vaccinating his COVID plus his booster shot but he is unsure if he had his flu shot or not.  Will check COVID, flu, and strep PCR.  Strep PCR is negative.  Rapid flu is negative.  COVID test is pending.  Will discharge patient home to isolate pending the results of his COVID test.  We will have patient use over-the-counter Tylenol and ibuprofen as needed for fever and body aches and will give ER precautions.   Final Clinical Impressions(s) / UC Diagnoses   Final diagnoses:  Upper respiratory tract infection, unspecified type     Discharge Instructions     Your test today did not reveal the presence of flu or strep.  You will need to isolate at home until the results of your COVID test have come back.  If your test is positive then you will need to quarantine for 5 days from when your symptoms started.  After the 5 days you can break quarantine if your symptoms have improved and you have not had a fever for 24 hours without taking Tylenol or ibuprofen.  Use Tylenol and ibuprofen over-the-counter as needed for pain and fever.  Gargle with warm salt water 2-3 times a day to help soothe your throat and provide some pain relief.  If you develop shortness of breath-especially at rest, if you are unable to speak in full sentences, or is a late sign your lips start turning blue you need to go to the ER for evaluation.    ED Prescriptions    None     PDMP not reviewed this encounter.   Margarette [place],  NP 05/25/20 [id number]

## 2020-05-25 NOTE — Discharge Instructions (Addendum)
Your test today did not reveal the presence of flu or strep.  You will need to isolate at home until the results of your COVID test have come back.  If your test is positive then you will need to quarantine for 5 days from when your symptoms started.  After the 5 days you can break quarantine if your symptoms have improved and you have not had a fever for 24 hours without taking Tylenol or ibuprofen.  Use Tylenol and ibuprofen over-the-counter as needed for pain and fever.  Gargle with warm salt water 2-3 times a day to help soothe your throat and provide some pain relief.  If you develop shortness of breath-especially at rest, if you are unable to speak in full sentences, or is a late sign your lips start turning blue you need to go to the ER for evaluation.

## 2020-05-26 ENCOUNTER — Ambulatory Visit: Payer: Self-pay

## 2020-05-26 LAB — SARS CORONAVIRUS 2 (TAT 6-24 HRS): SARS Coronavirus 2: NEGATIVE

## 2020-06-02 ENCOUNTER — Other Ambulatory Visit: Payer: Self-pay | Admitting: Internal Medicine

## 2020-06-07 ENCOUNTER — Other Ambulatory Visit: Payer: Self-pay | Admitting: Internal Medicine

## 2020-07-07 NOTE — Progress Notes (Signed)
Subjective:    Patient ID: Benjamin Chen, male    DOB: 1965-09-19, 55 y.o.   MRN: [id number]  HPI The patient is here for follow up of their chronic medical problems, including htn, DM, hyperlipidemia  He had his knee replaced since he was here.  It is swollen and it is painful on the lateral side.     He has lost weight.  He is not exercising - needs to see ortho for his knee.   Sometimes when he eats food he regurgitates food.  The food feels funny sometimes when he swallows.  he has GERD when he eats spicy foods or drinks soda.   He has lost weight and it is better with that.    Medications and allergies reviewed with patient and updated if appropriate.  Patient Active Problem List   Diagnosis Date Noted  . Primary osteoarthritis of right knee 03/01/2020  . Status post total knee replacement, right 03/01/2020  . Preop examination 01/08/2020  . Shortness of breath 07/03/2019  . Palpitations 07/03/2019  . GERD (gastroesophageal reflux disease) 07/03/2019  . Low testosterone in male 01/18/2019  . Fatty liver 03/27/2018  . Cholelithiasis 03/27/2018  . Fatigue 10/13/2017  . Obesity, morbid ([place]) 01/12/2017  . Chest pain 10/07/2016  . Erectile dysfunction 10/07/2016  . Gout 01/29/2016  . Arthralgia 01/24/2016  . Hyperlipidemia 01/24/2016  . Essential hypertension, benign 12/30/2015  . Diabetes ([place]) 12/30/2015  . OSA (obstructive sleep apnea) 12/30/2015  . Bilateral primary osteoarthritis of knee 12/30/2015  . Joint pain 12/30/2015    Current Outpatient Medications on File Prior to Visit  Medication Sig Dispense Refill  . amLODipine (NORVASC) 5 MG tablet TAKE 1 TABLET BY MOUTH EVERY DAY 30 tablet 8  . JANUVIA 100 MG tablet TAKE 1 TABLET BY MOUTH EVERY DAY 30 tablet 5  . JARDIANCE 10 MG TABS tablet TAKE 1 TABLET DAILY 30 tablet 8  . metFORMIN (GLUCOPHAGE-XR) 750 MG 24 hr tablet TAKE 2 TABLETS (1,500 MG TOTAL) BY MOUTH DAILY WITH BREAKFAST. 60 tablet 5  . pravastatin  (PRAVACHOL) 10 MG tablet TAKE 1 TABLET BY MOUTH EVERY DAY 30 tablet 5  . quinapril (ACCUPRIL) 20 MG tablet TAKE 1 TABLET BY MOUTH TWICE A DAY 60 tablet 5  . spironolactone (ALDACTONE) 25 MG tablet TAKE 1 TABLET BY MOUTH EVERY DAY 30 tablet 5   No current facility-administered medications on file prior to visit.    Past Medical History:  Diagnosis Date  . Allergy    seasonal  . Arthritis   . Diabetes mellitus without complication ([place])   . GERD (gastroesophageal reflux disease)    occasional heartburn/no meds  . Gout   . Hyperlipidemia   . Hypertension   . Sleep apnea    Do not use it.    Past Surgical History:  Procedure Laterality Date  . KNEE ARTHROSCOPY Right    right - torn meniscus  . KNEE ARTHROSCOPY Left    left - torn meniscus  . TOTAL KNEE ARTHROPLASTY Right 03/01/2020   Procedure: TOTAL KNEE ARTHROPLASTY;  Surgeon: Dorna Leitz, MD;  Location: [facility];  Service: Orthopedics;  Laterality: Right;    Social History   Socioeconomic History  . Marital status: Married    Spouse name: Not on file  . Number of children: Not on file  . Years of education: Not on file  . Highest education level: Not on file  Occupational History  . Not on file  Tobacco Use  .  Smoking status: Never Smoker  . Smokeless tobacco: Never Used  Vaping Use  . Vaping Use: Never used  Substance and Sexual Activity  . Alcohol use: Yes    Alcohol/week: 2.0 standard drinks    Types: 2 Standard drinks or equivalent per week  . Drug use: No  . Sexual activity: Yes    Birth control/protection: None    Comment: Married  Other Topics Concern  . Not on file  Social History Narrative  . Not on file   Social Determinants of Health   Financial Resource Strain: Not on file  Food Insecurity: Not on file  Transportation Needs: Not on file  Physical Activity: Not on file  Stress: Not on file  Social Connections: Not on file    Family History  Problem Relation Age of Onset  .  Hyperlipidemia Mother   . Hypertension Mother   . Mental illness Father   . Heart disease Maternal Aunt   . Diabetes Maternal Grandmother   . Heart disease Sister   . Hypertension Sister   . Thyroid disease Brother   . Colon cancer Neg Hx   . Esophageal cancer Neg Hx     Review of Systems  Constitutional: Negative for chills and fever.  Respiratory: Negative for cough, shortness of breath and wheezing.   Cardiovascular: Positive for leg swelling (R ankle). Negative for chest pain and palpitations.  Musculoskeletal: Positive for arthralgias (R knee).  Neurological: Negative for light-headedness and headaches.       Objective:   Vitals:   07/08/20 1332  BP: 120/76  Pulse: 80  Temp: 98.1 F (36.7 C)  SpO2: 98%   BP Readings from Last 3 Encounters:  07/08/20 120/76  05/25/20 (!) 129/95  05/16/20 132/89   Wt Readings from Last 3 Encounters:  07/08/20 255 lb (115.7 kg)  05/16/20 250 lb (113.4 kg)  02/19/20 271 lb 9 oz (123.2 kg)   Body mass index is 34.58 kg/m.   Physical Exam    Constitutional: Appears well-developed and well-nourished. No distress.  HENT:  Head: Normocephalic and atraumatic.  Neck: Neck supple. No tracheal deviation present. No thyromegaly present.  No cervical lymphadenopathy Cardiovascular: Normal rate, regular rhythm and normal heart sounds.   No murmur heard. No carotid bruit .  No edema Pulmonary/Chest: Effort normal and breath sounds normal. No respiratory distress. No has no wheezes. No rales.  Skin: Skin is warm and dry. Not diaphoretic.  Psychiatric: Normal mood and affect. Behavior is normal.      Assessment & Plan:    See Problem List for Assessment and Plan of chronic medical problems.    This visit occurred during the SARS-CoV-2 public health emergency.  Safety protocols were in place, including screening questions prior to the visit, additional usage of staff PPE, and extensive cleaning of exam room while observing  appropriate contact time as indicated for disinfecting solutions.

## 2020-07-07 NOTE — Patient Instructions (Addendum)
  Blood work was ordered.     Medications changes include :   Omeprazole 20 mg daily for reflux  Your prescription(s) have been submitted to your pharmacy. Please take as directed and contact our office if you believe you are having problem(s) with the medication(s).   A referral was ordered for GI.       Someone from their office will call you to schedule an appointment.    Please followup in 6 months

## 2020-07-08 ENCOUNTER — Ambulatory Visit: Payer: Commercial Managed Care - PPO | Admitting: Internal Medicine

## 2020-07-08 ENCOUNTER — Encounter: Payer: Self-pay | Admitting: Internal Medicine

## 2020-07-08 ENCOUNTER — Other Ambulatory Visit: Payer: Self-pay

## 2020-07-08 VITALS — BP 120/76 | HR 80 | Temp 98.1°F | Ht 72.0 in | Wt 255.0 lb

## 2020-07-08 DIAGNOSIS — I10 Essential (primary) hypertension: Secondary | ICD-10-CM | POA: Diagnosis not present

## 2020-07-08 DIAGNOSIS — K219 Gastro-esophageal reflux disease without esophagitis: Secondary | ICD-10-CM

## 2020-07-08 DIAGNOSIS — Z23 Encounter for immunization: Secondary | ICD-10-CM | POA: Diagnosis not present

## 2020-07-08 DIAGNOSIS — E119 Type 2 diabetes mellitus without complications: Secondary | ICD-10-CM | POA: Diagnosis not present

## 2020-07-08 DIAGNOSIS — E782 Mixed hyperlipidemia: Secondary | ICD-10-CM

## 2020-07-08 LAB — LIPID PANEL
Cholesterol: 152 mg/dL (ref 0–200)
HDL: 33 mg/dL — ABNORMAL LOW (ref >39.00)
LDL Cholesterol: 84 mg/dL (ref 0–99)
NonHDL: 119.06
Total CHOL/HDL Ratio: 5
Triglycerides: 177 mg/dL — ABNORMAL HIGH (ref 0.0–149.0)
VLDL: 35.4 mg/dL (ref 0.0–40.0)

## 2020-07-08 LAB — COMPREHENSIVE METABOLIC PANEL
ALT: 12 U/L (ref 0–53)
AST: 12 U/L (ref 0–37)
Albumin: 4.1 g/dL (ref 3.5–5.2)
Alkaline Phosphatase: 82 U/L (ref 39–117)
BUN: 17 mg/dL (ref 6–23)
CO2: 29 mEq/L (ref 19–32)
Calcium: 10 mg/dL (ref 8.4–10.5)
Chloride: 101 mEq/L (ref 96–112)
Creatinine, Ser: 1.08 mg/dL (ref 0.40–1.50)
GFR: 78.5 mL/min (ref >60.00)
Glucose, Bld: 96 mg/dL (ref 70–99)
Potassium: 4.2 mEq/L (ref 3.5–5.1)
Sodium: 136 mEq/L (ref 135–145)
Total Bilirubin: 0.9 mg/dL (ref 0.2–1.2)
Total Protein: 7.2 g/dL (ref 6.0–8.3)

## 2020-07-08 LAB — HEMOGLOBIN A1C: Hgb A1c MFr Bld: 7 % — ABNORMAL HIGH (ref 4.6–6.5)

## 2020-07-08 MED ORDER — OMEPRAZOLE 20 MG PO CPDR: 20.0000 mg | DELAYED_RELEASE_CAPSULE | Freq: Every day | ORAL | 5 refills | Status: DC

## 2020-07-08 NOTE — Assessment & Plan Note (Addendum)
Chronic - has not been on treatment Has some dysphagia and regurgitation Referral ordered for GI Start omeprazole 20 mg daily Work on weight loss GERD diet

## 2020-07-08 NOTE — Assessment & Plan Note (Signed)
Chronic BP well controlled Continue amlodipine 5 mg daily, quinapril 20 mg daily, spironolactone 25 mg daily cmp

## 2020-07-08 NOTE — Addendum Note (Signed)
Addended by: Marcina Millard on: 07/08/2020 03:42 PM   Modules accepted: Orders

## 2020-07-08 NOTE — Assessment & Plan Note (Signed)
Chronic Lab Results  Component Value Date   HGBA1C 6.8 (H) 01/08/2020   Has lost weight Stressed regular exercise and diabetic diet Continue weight loss efforts a1c today Continue metformin xr 1500 mg daily, jardiance 10 mg daily, januvia 100 mg daily

## 2020-07-08 NOTE — Assessment & Plan Note (Signed)
Chronic Check lipid panel  Continue pravastatin 10 mg dialy Regular exercise and healthy diet encouraged

## 2020-07-22 ENCOUNTER — Encounter: Payer: Self-pay | Admitting: Internal Medicine

## 2020-08-26 IMAGING — DX DG CHEST 2V
2 series · 2 of 2 positions shown · non-contrast
Comparison: None.

CLINICAL DATA: Shortness of breath

EXAM:
CHEST - 2 VIEW

[chest pa]
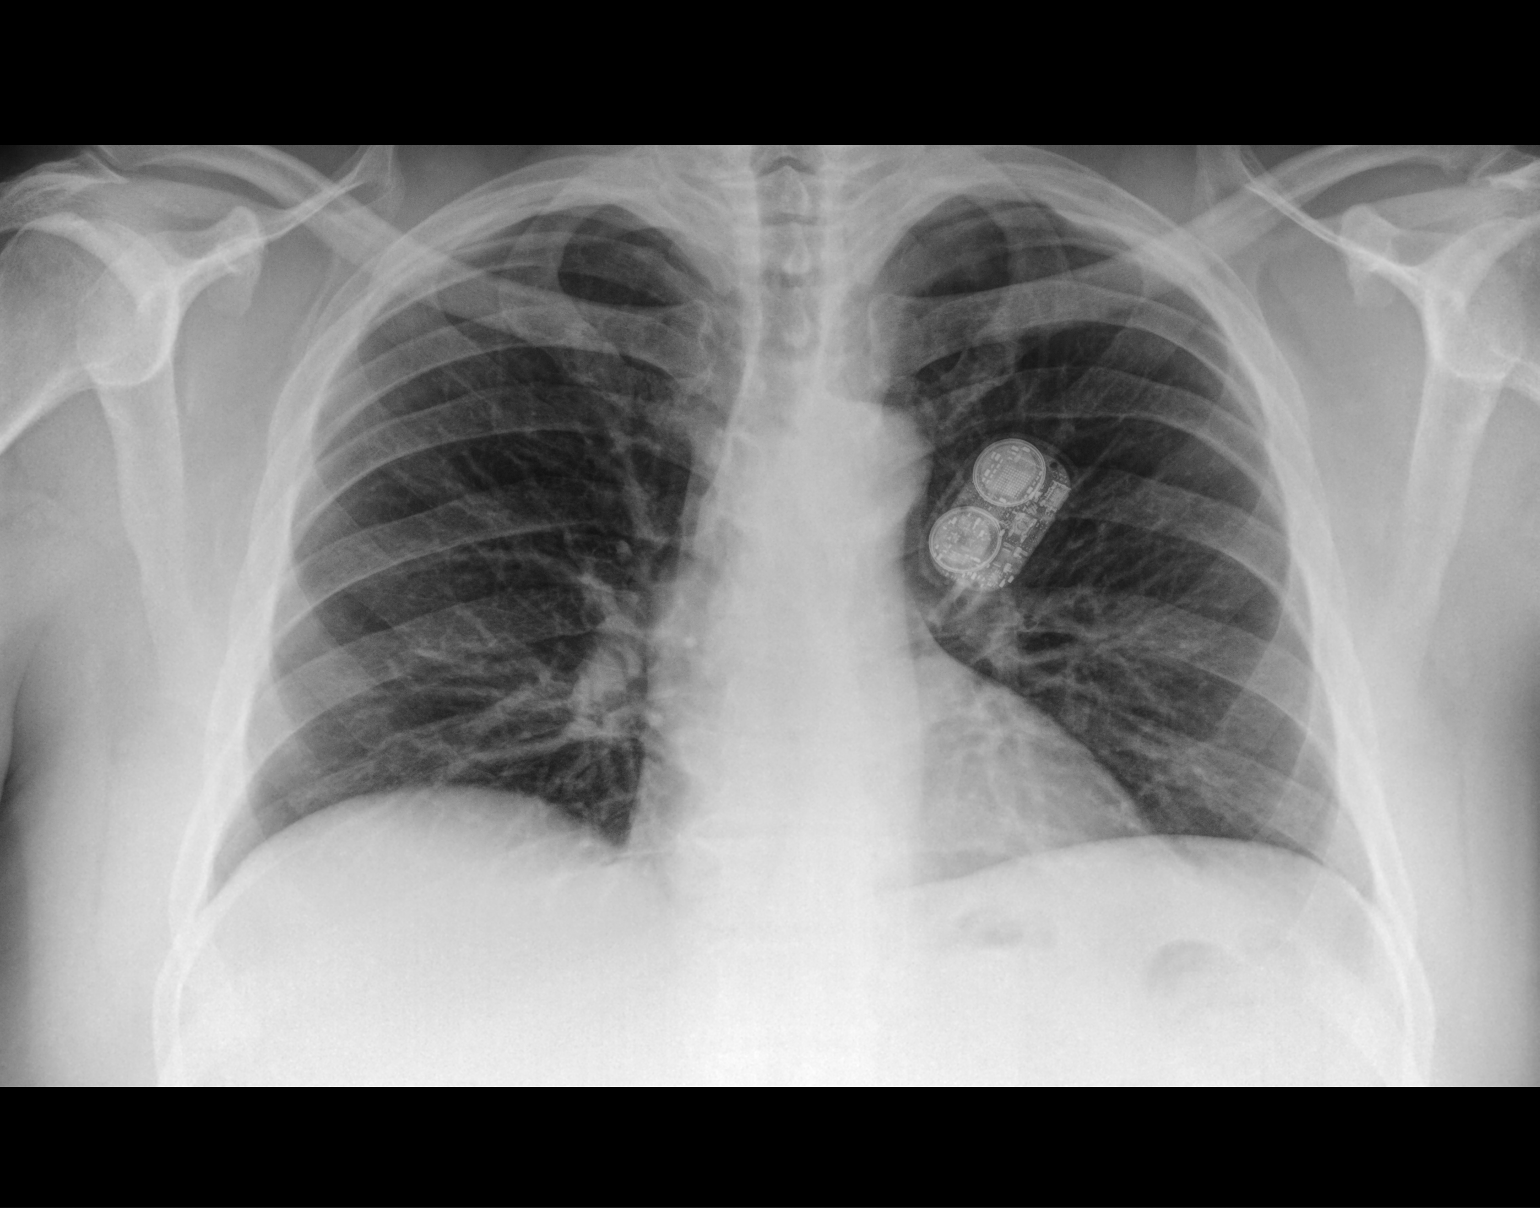

[chest lat]
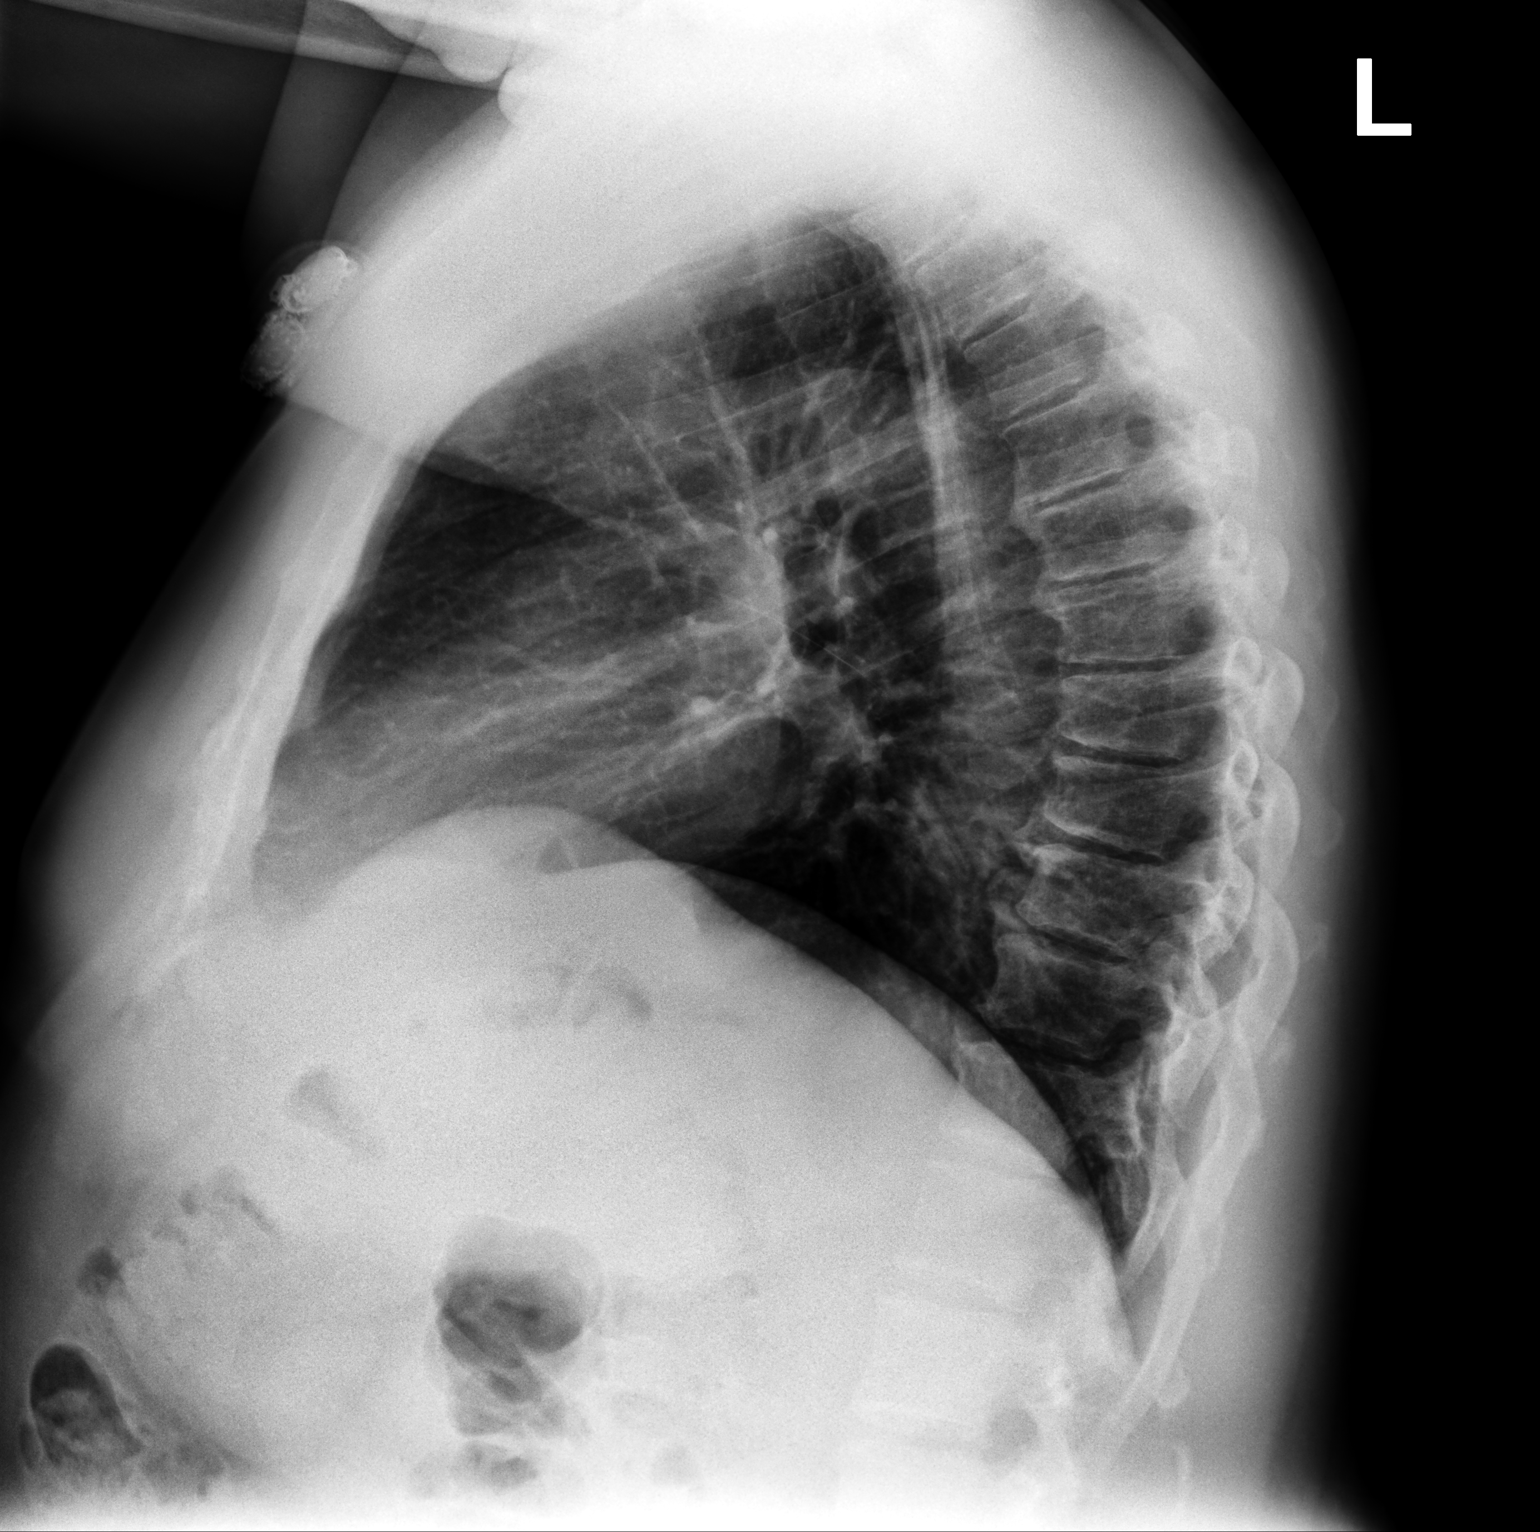

[2 of 2 positions shown; findings below may reference images not displayed]

FINDINGS: The heart size and mediastinal contours are within normal limits.
Both lungs are clear. Disc degenerative disease of the thoracic
spine.
IMPRESSION: No acute abnormality of the lungs.

## 2020-09-04 ENCOUNTER — Encounter: Payer: Self-pay | Admitting: Gastroenterology

## 2020-09-04 ENCOUNTER — Other Ambulatory Visit: Payer: Self-pay

## 2020-09-04 ENCOUNTER — Ambulatory Visit (INDEPENDENT_AMBULATORY_CARE_PROVIDER_SITE_OTHER): Payer: Commercial Managed Care - PPO | Admitting: Gastroenterology

## 2020-09-04 VITALS — BP 118/78 | HR 90 | Ht 73.5 in | Wt 261.0 lb

## 2020-09-04 DIAGNOSIS — R1310 Dysphagia, unspecified: Secondary | ICD-10-CM

## 2020-09-04 DIAGNOSIS — R1110 Vomiting, unspecified: Secondary | ICD-10-CM

## 2020-09-04 DIAGNOSIS — K219 Gastro-esophageal reflux disease without esophagitis: Secondary | ICD-10-CM | POA: Diagnosis not present

## 2020-09-04 DIAGNOSIS — Z86010 Personal history of colonic polyps: Secondary | ICD-10-CM

## 2020-09-04 MED ORDER — CLENPIQ 10-3.5-12 MG-GM -GM/160ML PO SOLN: 1.0000 | ORAL | 0 refills | Status: DC

## 2020-09-04 NOTE — Progress Notes (Signed)
P  Chief Complaint:    GERD  GI History: 55 year old male with a history of colon polyps, hyperlipidemia, obesity (BMI 34), DM, HTN, and OSA, initially seen by me 07/2019 for evaluation of dysphagia reflux.  1) Intermittent solid food dysphagia without odynophagia or weight loss.  Points to suprasternal notch.  2) GERD: Index symptoms of heartburn, regurgitation. Worse with spicy, tomato based sauces, chocolate, supine. Has eliminated caffeine.  Negative Cardiac work-up for NCCP.   3) Hepatic steatosis: Steatosis noted on CT and ultrasound in 2019. - 06/2020: Normal liver enzymes  Endoscopic Hx: - Colonoscopy (01/2018, Dr. Bryan Lemma): 5 polyps in the ascending, transverse, descending, sigmoid (path: Tubular Adenomas), diminutive rectal hyperplastic polyps, internal hemorrhoids.  Recommended repeat in 3 years  - CT 03/2018: Hepatic steatosis - Abd [place] 03/2018: GB sludge and stones without cholecystitis, fatty liver  HPI:     Patient is a 54 y.o. male presenting to the [facility]ology [facility] for follow-up.  Last seen by me on 07/14/2019 for evaluation of GERD and dysphagia.  I recommended EGD with esophageal dilation, but patient never scheduled.  Today, states he continues to have reflux symptoms.  No longer taking Prilosec.  Most bothersome is the regurgitation, which tends to be post prandial. Has had to regurgitate (effortless vomit) food out.  Rare nausea which tends to occur with supine. Some relief with prn Tums. Still with intermittent dysphagia, pointing to suprasternal notch.   Due for repeat colonoscopy for ongoing polyp surveillance.  No lower GI symptoms.   Review of systems:     No chest pain, no SOB, no fevers, no urinary sx   Past Medical History:  Diagnosis Date  . Allergy    seasonal  . Arthritis   . Diabetes mellitus without complication ([place])   . GERD (gastroesophageal reflux disease)    occasional heartburn/no meds  . Gout   . Hyperlipidemia   .  Hypertension   . Sleep apnea    Do not use it.    Patient's surgical history, family medical history, social history, medications and allergies were all reviewed in Epic    Current Outpatient Medications  Medication Sig Dispense Refill  . amLODipine (NORVASC) 5 MG tablet TAKE 1 TABLET BY MOUTH EVERY DAY 30 tablet 8  . JANUVIA 100 MG tablet TAKE 1 TABLET BY MOUTH EVERY DAY 30 tablet 5  . JARDIANCE 10 MG TABS tablet TAKE 1 TABLET DAILY 30 tablet 8  . metFORMIN (GLUCOPHAGE-XR) 750 MG 24 hr tablet TAKE 2 TABLETS (1,500 MG TOTAL) BY MOUTH DAILY WITH BREAKFAST. 60 tablet 5  . pravastatin (PRAVACHOL) 10 MG tablet TAKE 1 TABLET BY MOUTH EVERY DAY 30 tablet 5  . quinapril (ACCUPRIL) 20 MG tablet TAKE 1 TABLET BY MOUTH TWICE A DAY 60 tablet 5  . spironolactone (ALDACTONE) 25 MG tablet TAKE 1 TABLET BY MOUTH EVERY DAY 30 tablet 5  . omeprazole (PRILOSEC) 20 MG capsule Take 1 capsule (20 mg total) by mouth daily. Take 30 minutes prior to a meal (Patient not taking: Reported on 09/04/2020) 30 capsule 5   No current facility-administered medications for this visit.    Physical Exam:     BP 118/78   Pulse 90   Ht 6' 1.5" (1.867 m)   Wt 261 lb (118.4 kg)   BMI 33.97 kg/m   GENERAL:  Pleasant male in NAD PSYCH: : Cooperative, normal affect EENT:  conjunctiva pink, mucous membranes moist, neck supple without masses CARDIAC:  RRR, no murmur heard,  no peripheral edema PULM: Normal respiratory effort, lungs CTA bilaterally, no wheezing ABDOMEN:  Nondistended, soft, nontender. No obvious masses, no hepatomegaly,  normal bowel sounds SKIN:  turgor, no lesions seen Musculoskeletal:  Normal muscle tone, normal strength NEURO: Alert and oriented x 3, no focal neurologic deficits   IMPRESSION and PLAN:    1) GERD 2) Regurgitation 3) Dysphagia  - EGD with esophageal dilation and biopsies as appropriate - Evaluate for erosive esophagitis, LES laxity, hiatal hernia at time of EGD - Antireflux  lifestyle/dietary modifications - Resume Prilosec  4) History of colon polyps - Repeat colonoscopy for ongoing polyp surveillance (5 tubular adenomas in 2019)  The indications, risks, and benefits of EGD and colonoscopy were explained to the patient in detail. Risks include but are not limited to bleeding, perforation, adverse reaction to medications, and cardiopulmonary compromise. Sequelae include but are not limited to the possibility of surgery, hospitalization, and mortality. The patient verbalized understanding and wished to proceed. All questions answered, referred to scheduler and bowel prep ordered. Further recommendations pending results of the exam.            Lavena Bullion ,DO, FACG 09/04/2020, 9:07 AM

## 2020-09-04 NOTE — Patient Instructions (Signed)
If you are age 54 or older, your body mass index should be between 23-30. Your Body mass index is 33.97 kg/m. If this is out of the aforementioned range listed, please consider follow up with your Primary Care Provider.  If you are age 75 or younger, your body mass index should be between 19-25. Your Body mass index is 33.97 kg/m. If this is out of the aformentioned range listed, please consider follow up with your Primary Care Provider.   We have sent the following medications to your pharmacy for you to pick up at your convenience:  Clenpiq for colonoscopy  Due to recent changes in healthcare laws, you may see the results of your imaging and laboratory studies on MyChart before your provider has had a chance to review them.  We understand that in some cases there may be results that are confusing or concerning to you. Not all laboratory results come back in the same time frame and the provider may be waiting for multiple results in order to interpret others.  Please give [place] 48 hours in order for your provider to thoroughly review all the results before contacting the office for clarification of your results.    Thank you for choosing me and [facility].  Vito Cirigliano, D.O.

## 2020-10-04 ENCOUNTER — Encounter: Payer: Self-pay | Admitting: Gastroenterology

## 2020-10-11 ENCOUNTER — Other Ambulatory Visit: Payer: Self-pay | Admitting: Internal Medicine

## 2020-10-18 ENCOUNTER — Other Ambulatory Visit: Payer: Self-pay

## 2020-10-18 ENCOUNTER — Ambulatory Visit (AMBULATORY_SURGERY_CENTER): Payer: Commercial Managed Care - PPO | Admitting: Gastroenterology

## 2020-10-18 ENCOUNTER — Encounter: Payer: Self-pay | Admitting: Gastroenterology

## 2020-10-18 VITALS — BP 129/85 | HR 69 | Temp 97.5°F | Resp 24 | Ht 73.5 in | Wt 261.0 lb

## 2020-10-18 DIAGNOSIS — K222 Esophageal obstruction: Secondary | ICD-10-CM | POA: Diagnosis not present

## 2020-10-18 DIAGNOSIS — K2970 Gastritis, unspecified, without bleeding: Secondary | ICD-10-CM

## 2020-10-18 DIAGNOSIS — K3189 Other diseases of stomach and duodenum: Secondary | ICD-10-CM

## 2020-10-18 DIAGNOSIS — B9681 Helicobacter pylori [H. pylori] as the cause of diseases classified elsewhere: Secondary | ICD-10-CM

## 2020-10-18 DIAGNOSIS — K635 Polyp of colon: Secondary | ICD-10-CM

## 2020-10-18 DIAGNOSIS — D125 Benign neoplasm of sigmoid colon: Secondary | ICD-10-CM

## 2020-10-18 DIAGNOSIS — K621 Rectal polyp: Secondary | ICD-10-CM

## 2020-10-18 DIAGNOSIS — R1310 Dysphagia, unspecified: Secondary | ICD-10-CM | POA: Diagnosis not present

## 2020-10-18 DIAGNOSIS — D128 Benign neoplasm of rectum: Secondary | ICD-10-CM

## 2020-10-18 DIAGNOSIS — Z86010 Personal history of colonic polyps: Secondary | ICD-10-CM

## 2020-10-18 DIAGNOSIS — K2980 Duodenitis without bleeding: Secondary | ICD-10-CM | POA: Diagnosis not present

## 2020-10-18 DIAGNOSIS — K641 Second degree hemorrhoids: Secondary | ICD-10-CM

## 2020-10-18 HISTORY — PX: UPPER GASTROINTESTINAL ENDOSCOPY: SHX188

## 2020-10-18 MED ORDER — SODIUM CHLORIDE 0.9 % IV SOLN: 500.0000 mL | Freq: Once | INTRAVENOUS | Status: DC

## 2020-10-18 MED ORDER — PANTOPRAZOLE SODIUM 40 MG PO TBEC: 40.0000 mg | DELAYED_RELEASE_TABLET | Freq: Two times a day (BID) | ORAL | 3 refills | Status: DC

## 2020-10-18 NOTE — Op Note (Signed)
[facility] Patient Name: Benjamin Chen Procedure Date: 10/18/2020 2:10 PM MRN: [id number] Endoscopist: Gerrit Heck , MD Age: 55 Referring MD:  Date of Birth: [birth year] Gender: Male Account #: [id number] Procedure:                Upper GI endoscopy Indications:              Dysphagia, Suspected esophageal reflux,                            Regurgitation Medicines:                Monitored Anesthesia Care Procedure:                Pre-Anesthesia Assessment:                           - Prior to the procedure, a History and Physical                            was performed, and patient medications and                            allergies were reviewed. The patient's tolerance of                            previous anesthesia was also reviewed. The risks                            and benefits of the procedure and the sedation                            options and risks were discussed with the patient.                            All questions were answered, and informed consent                            was obtained. Prior Anticoagulants: The patient has                            taken no previous anticoagulant or antiplatelet                            agents. ASA Grade Assessment: II - A patient with                            mild systemic disease. After reviewing the risks                            and benefits, the patient was deemed in                            satisfactory condition to undergo the procedure.  After obtaining informed consent, the endoscope was                            passed under direct vision. Throughout the                            procedure, the patient's blood pressure, pulse, and                            oxygen saturations were monitored continuously. The                            Endoscope was introduced through the mouth, and                            advanced to the second part of duodenum. The upper                             GI endoscopy was accomplished without difficulty.                            The patient tolerated the procedure well. Scope In: Scope Out: Findings:                 One benign-appearing, intrinsic moderate stenosis                            was found in the lower third of the esophagus. This                            stenosis measured 1 cm (in length). The stenosis                            was traversed. A TTS dilator was passed through the                            scope. Dilation with a 13.5-14.5-15.5 mm balloon                            dilator was performed to 15.5 mm. The dilation site                            was examined and showed mild mucosal disruption and                            moderate improvement in luminal narrowing.                            Estimated blood loss was minimal.                           The upper third of the esophagus and middle third  of the esophagus were normal.                           Diffuse moderate inflammation characterized by                            erythema was found in the gastric fundus, in the                            gastric body, at the incisura and in the gastric                            antrum. Biopsies were taken with a cold forceps for                            Helicobacter pylori testing. Estimated blood loss                            was minimal.                           A single 6 mm submucosal nodule with no bleeding                            was found in the gastric fundus. The lesion was                            firm and semi-mobile when pressed with closed                            forceps. Negative pillow sign. The overlying mucosa                            was normal appearing. Bite-on-bite biopsies were                            taken with a cold forceps for histology. Estimated                            blood loss was minimal.                            Localized moderate inflammation characterized by                            congestion (edema), erosions and erythema was found                            in the duodenal bulb. Biopsies were taken with a                            cold forceps for histology. Estimated blood loss  was minimal.                           The second portion of the duodenum was normal. Complications:            No immediate complications. Estimated Blood Loss:     Estimated blood loss was minimal. Impression:               - Benign-appearing esophageal stenosis. Dilated.                           - Normal upper third of esophagus and middle third                            of esophagus.                           - Gastritis. Biopsied.                           - A single submucosal papule (nodule) found in the                            stomach. Biopsied.                           - Duodenitis. Biopsied.                           - Normal second portion of the duodenum. Recommendation:           - Patient has a contact number available for                            emergencies. The signs and symptoms of potential                            delayed complications were discussed with the                            patient. Return to normal activities tomorrow.                            Written discharge instructions were provided to the                            patient.                           - Soft diet today.                           - Continue present medications.                           - Await pathology results.                           -  Stop Prilosec. Start Protonix (pantoprazole) 40                            mg PO BID for 8 weeks to promote mucosal healing,                            then reduce to 40 mg daily.                           - Repeat upper endoscopy in 8 weeks to check                            healing and for retreatment.                            - If pathology from the subepithelial gastric                            nodule is unremarkable, and if still present on                            repeat upper endoscopy, will plan on Endoscopic                            Ultrasound for further evaluation. Gerrit Heck, MD 10/18/2020 3:09:48 PM

## 2020-10-18 NOTE — Op Note (Signed)
[facility] Patient Name: Benjamin Chen Procedure Date: 10/18/2020 2:03 PM MRN: [id number] Endoscopist: Gerrit Heck , MD Age: 55 Referring MD:  Date of Birth: 08-16-1965 Gender: Male Account #: [id number] Procedure:                Colonoscopy Indications:              Surveillance: Personal history of adenomatous                            polyps on last colonoscopy 3 years ago                           Colonoscopy in 2019 with 5 tubular adenomas and                            rectal hyperplastic polyps. Medicines:                Monitored Anesthesia Care Procedure:                Pre-Anesthesia Assessment:                           - Prior to the procedure, a History and Physical                            was performed, and patient medications and                            allergies were reviewed. The patient's tolerance of                            previous anesthesia was also reviewed. The risks                            and benefits of the procedure and the sedation                            options and risks were discussed with the patient.                            All questions were answered, and informed consent                            was obtained. Prior Anticoagulants: The patient has                            taken no previous anticoagulant or antiplatelet                            agents. ASA Grade Assessment: II - A patient with                            mild systemic disease. After reviewing the risks  and benefits, the patient was deemed in                            satisfactory condition to undergo the procedure.                           After obtaining informed consent, the colonoscope                            was passed under direct vision. Throughout the                            procedure, the patient's blood pressure, pulse, and                            oxygen saturations were monitored continuously. The                             Olympus [id number]) [id number] was introduced                            through the anus and advanced to the the terminal                            ileum. The colonoscopy was performed without                            difficulty. The patient tolerated the procedure                            well. The quality of the bowel preparation was                            good. The terminal ileum, ileocecal valve,                            appendiceal orifice, and rectum were photographed. Scope In: 2:37:49 PM Scope Out: 3:01:33 PM Scope Withdrawal Time: 0 hours 20 minutes 14 seconds  Total Procedure Duration: 0 hours 23 minutes 44 seconds  Findings:                 Hemorrhoids were found on perianal exam.                           A 5 mm polyp was found in the sigmoid colon. The                            polyp was sessile. The polyp was removed with a                            cold snare. Resection and retrieval were complete.                            Estimated blood loss was minimal.  Two sessile polyps were found in the rectum and                            distal sigmoid colon. The polyps were 1 to 2 mm in                            size. Attempted to resect with cold snare, but                            unable to grasp the diminutive sized polyps. These                            polyps were instead removed with a cold biopsy                            forceps. Resection and retrieval were complete.                            Estimated blood loss was minimal.                           Non-bleeding internal hemorrhoids were found during                            retroflexion. The hemorrhoids were medium-sized and                            Grade II (internal hemorrhoids that prolapse but                            reduce spontaneously).                           The terminal ileum appeared normal. Complications:             No immediate complications. Estimated Blood Loss:     Estimated blood loss was minimal. Impression:               - Hemorrhoids found on perianal exam.                           - One 5 mm polyp in the sigmoid colon, removed with                            a cold snare. Resected and retrieved.                           - Two 1 to 2 mm polyps in the rectum and in the                            distal sigmoid colon, removed with a cold biopsy                            forceps. Resected and retrieved.                           -  Non-bleeding internal hemorrhoids.                           - The examined portion of the ileum was normal. Recommendation:           - Patient has a contact number available for                            emergencies. The signs and symptoms of potential                            delayed complications were discussed with the                            patient. Return to normal activities tomorrow.                            Written discharge instructions were provided to the                            patient.                           - Resume previous diet.                           - Continue present medications.                           - Await pathology results.                           - Repeat colonoscopy in 3 - 5 years for                            surveillance based on pathology results.                           - Internal hemorrhoids were noted on this study and                            may be amenable to hemorrhoid band ligation. If you                            are interested in further treatment of these                            hemorrhoids with band ligation, please contact my                            clinic to set up an appointment for evaluation and                            treatment.                           -  Use fiber, for example Citrucel, Fibercon, Konsyl                            or Metamucil. Gerrit Heck, MD 10/18/2020  3:14:09 PM

## 2020-10-18 NOTE — Progress Notes (Signed)
To pacu, VSS. Report to [email or web address] 

## 2020-10-18 NOTE — Patient Instructions (Signed)
Information on polyps and hemorrhoids given to you today.  Await pathology results.  Post dilation diet today.  See handout provided.  Stop Prilosec.  Stop Protonix (pantoprazole) 40 mg twice a day for 8 weeks to promote mucosal healing, then reduce to once a day.  Repeat upper endoscopy in 8 weeks to check for healing.  Use fiber, for example Fibercon, Citrucel, Konsyl or Metamucil 1-2 times daily to try and bulk stools.  YOU HAD AN ENDOSCOPIC PROCEDURE TODAY AT [facility]:   Refer to the procedure report that was given to you for any specific questions about what was found during the examination.  If the procedure report does not answer your questions, please call your gastroenterologist to clarify.  If you requested that your care partner not be given the details of your procedure findings, then the procedure report has been included in a sealed envelope for you to review at your convenience later.  YOU SHOULD EXPECT: Some feelings of bloating in the abdomen. Passage of more gas than usual.  Walking can help get rid of the air that was put into your GI tract during the procedure and reduce the bloating. If you had a lower endoscopy (such as a colonoscopy or flexible sigmoidoscopy) you may notice spotting of blood in your stool or on the toilet paper. If you underwent a bowel prep for your procedure, you may not have a normal bowel movement for a few days.  Please Note:  You might notice some irritation and congestion in your nose or some drainage.  This is from the oxygen used during your procedure.  There is no need for concern and it should clear up in a day or so.  SYMPTOMS TO REPORT IMMEDIATELY:  Following lower endoscopy (colonoscopy or flexible sigmoidoscopy):  Excessive amounts of blood in the stool  Significant tenderness or worsening of abdominal pains  Swelling of the abdomen that is new, acute  Fever of 100F or higher  Following upper endoscopy  (EGD)  Vomiting of blood or coffee ground material  New chest pain or pain under the shoulder blades  Painful or persistently difficult swallowing  New shortness of breath  Fever of 100F or higher  Black, tarry-looking stools  For urgent or emergent issues, a gastroenterologist can be reached at any hour by calling [phone]. Do not use MyChart messaging for urgent concerns.    DIET:  We do recommend a small meal at first, but then you may proceed to your regular diet.  Drink plenty of fluids but you should avoid alcoholic beverages for 24 hours.  ACTIVITY:  You should plan to take it easy for the rest of today and you should NOT DRIVE or use heavy machinery until tomorrow (because of the sedation medicines used during the test).    FOLLOW UP: Our staff will call the number listed on your records 48-72 hours following your procedure to check on you and address any questions or concerns that you may have regarding the information given to you following your procedure. If we do not reach you, we will leave a message.  We will attempt to reach you two times.  During this call, we will ask if you have developed any symptoms of COVID 19. If you develop any symptoms (ie: fever, flu-like symptoms, shortness of breath, cough etc.) before then, please call [phone].  If you test positive for Covid 19 in the 2 weeks post procedure, please call and report this information to  [place].    If any biopsies were taken you will be contacted by phone or by letter within the next 1-3 weeks.  Please call us at [phone] if you have not heard about the biopsies in 3 weeks.    SIGNATURES/CONFIDENTIALITY: You and/or your care partner have signed paperwork which will be entered into your electronic medical record.  These signatures attest to the fact that that the information above on your After Visit Summary has been reviewed and is understood.  Full responsibility of the confidentiality of this discharge  information lies with you and/or your care-partner.

## 2020-10-18 NOTE — Progress Notes (Signed)
Called to room to assist during endoscopic procedure.  Patient ID and intended procedure confirmed with present staff. Received instructions for my participation in the procedure from the performing physician.  

## 2020-10-22 ENCOUNTER — Telehealth: Payer: Self-pay | Admitting: *Deleted

## 2020-10-22 ENCOUNTER — Telehealth: Payer: Self-pay

## 2020-10-22 NOTE — Telephone Encounter (Signed)
  Follow up Call-  Call back number 10/18/2020  Post procedure Call Back phone  # [phone]  Permission to leave phone message Yes  Some recent data might be hidden     Patient questions:  Do you have a fever, pain , or abdominal swelling? No. Pain Score  0 *  Have you tolerated food without any problems? Yes.    Have you been able to return to your normal activities? Yes.    Do you have any questions about your discharge instructions: Diet   No. Medications  No. Follow up visit  No.  Do you have questions or concerns about your Care? No.  Actions: * If pain score is 4 or above: No action needed, pain <4.Have you developed a fever since your procedure? no  2.   Have you had an respiratory symptoms (SOB or cough) since your procedure? no  3.   Have you tested positive for COVID 19 since your procedure no  4.   Have you had any family members/close contacts diagnosed with the COVID 19 since your procedure?  no   If yes to any of these questions please route to Joylene John, RN and Joella Prince, RN

## 2020-10-22 NOTE — Telephone Encounter (Signed)
Called to schedule repeat EGD for 8 week follow up with Dr. Bryan Lemma.  Left message for patient to call back to schedule after no answer.

## 2020-10-25 ENCOUNTER — Telehealth: Payer: Self-pay

## 2020-10-25 MED ORDER — METRONIDAZOLE 250 MG PO TABS: 250.0000 mg | ORAL_TABLET | Freq: Four times a day (QID) | ORAL | 0 refills | Status: DC

## 2020-10-25 MED ORDER — PEPTO-BISMOL 262 MG PO TABS: 2.0000 | ORAL_TABLET | Freq: Four times a day (QID) | ORAL | 0 refills | Status: DC

## 2020-10-25 MED ORDER — DOXYCYCLINE MONOHYDRATE 100 MG PO TABS: 100.0000 mg | ORAL_TABLET | Freq: Two times a day (BID) | ORAL | 0 refills | Status: DC

## 2020-10-25 NOTE — Telephone Encounter (Signed)
-----   Message from Ryan, DO sent at 10/24/2020  2:27 PM EDT ----- Please call this patient to relay the following results from the recent colonoscopy/EGD: - The polyps resected during the colonoscopy were benign Hyperplastic Polyps. These polyps are NOT precancerous and harbor no malignant potential.  Based on the number of adenomatous polyps on the prior study and per current GI societal guidelines, I recommend repeat colonoscopy in 5 years for ongoing surveillance.  -The biopsies from the recent upper GI Endoscopy were notable for H. Pylori gastritis and duodenitis, and will plan on treating with quad therapy as below.  The biopsies from the gastric nodule also demonstrate inflammation with H. pylori infection.  Please confirm no medication allergies to the prescribed regimen.   1) Continue pantoprazole (Protonix) 40 mg bid as prescribed at the time of upper endoscopy 2) Pepto Bismol 2 tabs (262 mg each) 4 times a day x 14 d 3) Metronidazole 250 mg 4 times a day x 14 d 4) doxycycline 100 mg 2 times a day x 14 d  Plan for repeat upper endoscopy after 6-8 weeks to ensure resolution of gastritis.  Plan for repeat biopsies at that time to confirm eradication.  Please ensure that he is off PPI for at least 7-10 days prior to repeat upper endoscopy.  Dx: H. Pylori gastritis

## 2020-10-25 NOTE — Telephone Encounter (Signed)
Spoke to patient. Scheduled for 8-19 for EGD and nurse visit 8-3. If patient started regimen on the 20th and 2 weeks would be July 4th and 6 weeks after completion would be 8-15. Patient isn't allergic and he said that he will call us back regarding the pepto bismol since it isn't covered by insurance. I told him that pepto bismol is over the counter and we had to give 108 tablets for 20 dollars and some change so its not a bad price but he said he will call us back to let us know if he wants something else.

## 2020-10-25 NOTE — Telephone Encounter (Signed)
LVM for patient to return call.  Medications have been sent and patient need to know the results and to schedule another EGD and he needs to be off the PPI 7-10 days prior to procedure. Recall colon placed in chart for 10-09-2025

## 2020-11-08 ENCOUNTER — Telehealth: Payer: Self-pay

## 2020-11-08 NOTE — Telephone Encounter (Signed)
Pantoprazole 40 Mg tablet has been approved from 11/07/20-01/06/21

## 2020-11-15 NOTE — Telephone Encounter (Signed)
Inbound call from patient stating they have not been able to get all the medication.  Please advise.

## 2020-11-20 NOTE — Telephone Encounter (Signed)
I called the pharmacy, Pt. hasn't picked up his pantoprazole yet. I left VM stating that his medication is ready to pick up at the pharmacy, and call me back if he has any questions.

## 2020-11-30 ENCOUNTER — Other Ambulatory Visit: Payer: Self-pay | Admitting: Internal Medicine

## 2020-12-11 ENCOUNTER — Other Ambulatory Visit: Payer: Self-pay

## 2020-12-11 ENCOUNTER — Ambulatory Visit (AMBULATORY_SURGERY_CENTER): Payer: Commercial Managed Care - PPO | Admitting: *Deleted

## 2020-12-11 VITALS — Ht 73.5 in | Wt 261.0 lb

## 2020-12-11 DIAGNOSIS — K2970 Gastritis, unspecified, without bleeding: Secondary | ICD-10-CM

## 2020-12-11 DIAGNOSIS — K2990 Gastroduodenitis, unspecified, without bleeding: Secondary | ICD-10-CM

## 2020-12-11 NOTE — Progress Notes (Signed)
Patient's pre-visit was done today over the phone with the patient due to COVID-19 pandemic. Name,DOB and address verified. Insurance verified. Patient denies any allergies to Eggs and Soy. Patient denies any problems with anesthesia/sedation. Patient denies taking diet pills or blood thinners. No home Oxygen. Packet of Prep instructions mailed to patient including a copy of a consent form-pt is aware. Patient understands to call us back with any questions or concerns. Patient is aware of our care-partner policy and 0000000 safety protocol.   EMMI education assigned to the patient for the procedure, sent to [facility].   The patient is COVID-19 vaccinated.  + covid about 1 month ago per pt.

## 2020-12-18 ENCOUNTER — Other Ambulatory Visit: Payer: Self-pay | Admitting: Internal Medicine

## 2020-12-25 ENCOUNTER — Telehealth: Payer: Self-pay | Admitting: Gastroenterology

## 2020-12-25 NOTE — Telephone Encounter (Signed)
Benjamin Chen,   Patient call to cancel procedure for 8/19 due to not having anyone to bring him. Rescheduled for 8/25.  Thank you.

## 2020-12-27 ENCOUNTER — Encounter: Payer: Commercial Managed Care - PPO | Admitting: Gastroenterology

## 2020-12-31 ENCOUNTER — Encounter: Payer: Self-pay | Admitting: Gastroenterology

## 2021-01-02 ENCOUNTER — Encounter: Payer: Self-pay | Admitting: Gastroenterology

## 2021-01-02 ENCOUNTER — Ambulatory Visit (AMBULATORY_SURGERY_CENTER): Payer: Commercial Managed Care - PPO | Admitting: Gastroenterology

## 2021-01-02 ENCOUNTER — Other Ambulatory Visit: Payer: Self-pay

## 2021-01-02 VITALS — BP 131/91 | HR 72 | Temp 97.3°F | Resp 12 | Ht 73.5 in | Wt 261.0 lb

## 2021-01-02 DIAGNOSIS — K3189 Other diseases of stomach and duodenum: Secondary | ICD-10-CM

## 2021-01-02 DIAGNOSIS — K2970 Gastritis, unspecified, without bleeding: Secondary | ICD-10-CM

## 2021-01-02 DIAGNOSIS — R12 Heartburn: Secondary | ICD-10-CM | POA: Diagnosis not present

## 2021-01-02 DIAGNOSIS — R1319 Other dysphagia: Secondary | ICD-10-CM | POA: Diagnosis not present

## 2021-01-02 DIAGNOSIS — Z8619 Personal history of other infectious and parasitic diseases: Secondary | ICD-10-CM

## 2021-01-02 DIAGNOSIS — K2990 Gastroduodenitis, unspecified, without bleeding: Secondary | ICD-10-CM

## 2021-01-02 DIAGNOSIS — K2950 Unspecified chronic gastritis without bleeding: Secondary | ICD-10-CM

## 2021-01-02 DIAGNOSIS — K222 Esophageal obstruction: Secondary | ICD-10-CM | POA: Diagnosis not present

## 2021-01-02 MED ORDER — SODIUM CHLORIDE 0.9 % IV SOLN: 500.0000 mL | Freq: Once | INTRAVENOUS | Status: DC

## 2021-01-02 MED ORDER — PANTOPRAZOLE SODIUM 40 MG PO TBEC: 40.0000 mg | DELAYED_RELEASE_TABLET | Freq: Every day | ORAL | 3 refills | Status: DC

## 2021-01-02 NOTE — Progress Notes (Signed)
Report to PACU, RN, vss, BBS= Clear.  

## 2021-01-02 NOTE — Patient Instructions (Signed)
Await pathology results from the biopsies taken today.  Soft diet today - see handout.  Resume regular diet tomorrow.  Continue present medications. Restart Protonix 40 mg by mouth each day to control reflux symptoms.    Repeat upper endoscopy as needed for retreatment.   YOU HAD AN ENDOSCOPIC PROCEDURE TODAY AT [facility]:   Refer to the procedure report that was given to you for any specific questions about what was found during the examination.  If the procedure report does not answer your questions, please call your gastroenterologist to clarify.  If you requested that your care partner not be given the details of your procedure findings, then the procedure report has been included in a sealed envelope for you to review at your convenience later.  YOU SHOULD EXPECT: Some feelings of bloating in the abdomen. Passage of more gas than usual.  Walking can help get rid of the air that was put into your GI tract during the procedure and reduce the bloating. If you had a lower endoscopy (such as a colonoscopy or flexible sigmoidoscopy) you may notice spotting of blood in your stool or on the toilet paper. If you underwent a bowel prep for your procedure, you may not have a normal bowel movement for a few days.  Please Note:  You might notice some irritation and congestion in your nose or some drainage.  This is from the oxygen used during your procedure.  There is no need for concern and it should clear up in a day or so.  SYMPTOMS TO REPORT IMMEDIATELY:   Following upper endoscopy (EGD)  Vomiting of blood or coffee ground material  New chest pain or pain under the shoulder blades  Painful or persistently difficult swallowing  New shortness of breath  Fever of 100F or higher  Black, tarry-looking stools  For urgent or emergent issues, a gastroenterologist can be reached at any hour by calling [phone]. Do not use MyChart messaging for urgent concerns.    DIET:  We  do recommend a small meal at first, but then you may proceed to your regular diet.  Drink plenty of fluids but you should avoid alcoholic beverages for 24 hours.  ACTIVITY:  You should plan to take it easy for the rest of today and you should NOT DRIVE or use heavy machinery until tomorrow (because of the sedation medicines used during the test).    FOLLOW UP: Our staff will call the number listed on your records 48-72 hours following your procedure to check on you and address any questions or concerns that you may have regarding the information given to you following your procedure. If we do not reach you, we will leave a message.  We will attempt to reach you two times.  During this call, we will ask if you have developed any symptoms of COVID 19. If you develop any symptoms (ie: fever, flu-like symptoms, shortness of breath, cough etc.) before then, please call [phone].  If you test positive for Covid 19 in the 2 weeks post procedure, please call and report this information to [place].    If any biopsies were taken you will be contacted by phone or by letter within the next 1-3 weeks.  Please call us at [phone] if you have not heard about the biopsies in 3 weeks.    SIGNATURES/CONFIDENTIALITY: You and/or your care partner have signed paperwork which will be entered into your electronic medical record.  These signatures attest to  the fact that that the information above on your After Visit Summary has been reviewed and is understood.  Full responsibility of the confidentiality of this discharge information lies with you and/or your care-partner.

## 2021-01-02 NOTE — Progress Notes (Signed)
Pt's states no medical or surgical changes since previsit or office visit. VS assessed by [place] ?

## 2021-01-02 NOTE — Op Note (Addendum)
[facility] Patient Name: Benjamin Chen Procedure Date: 01/02/2021 1:11 PM MRN: [id number] Endoscopist: Gerrit Heck , MD Age: 55 Referring MD:  Date of Birth: Dec 28, 1965 Gender: Male Account #: [id number] Procedure:                Upper GI endoscopy Indications:              Dysphagia, Heartburn, Follow-up of esophageal                            stricture, For therapy of esophageal stricture,                            Follow-up of Helicobacter pylori                           EGD on 10/18/2020: Lower esophageal stricture,                            dilated with 15.5 mm TTS balloon, moderate H pylori                            gastritis, 6 mm submucosal nodule in fundus (path:                            chronic active gastritis with H pylori; no                            dysplasia), moderate duodenitis. Treated with                            Protonix 40 mg PO BID x8 weeks with clinical                            improvement. He reduced to 40 mg/day with continued                            control of reflux, but breakthrough this week as he                            held PPI x7 days for procedure today. Presents                            today to evaluate for mucosal healing and repeat                            dilation as needed. Medicines:                Monitored Anesthesia Care Procedure:                Pre-Anesthesia Assessment:                           - Prior to the procedure, a History and Physical  was performed, and patient medications and                            allergies were reviewed. The patient's tolerance of                            previous anesthesia was also reviewed. The risks                            and benefits of the procedure and the sedation                            options and risks were discussed with the patient.                            All questions were answered, and informed consent                             was obtained. Prior Anticoagulants: The patient has                            taken no previous anticoagulant or antiplatelet                            agents. ASA Grade Assessment: II - A patient with                            mild systemic disease. After reviewing the risks                            and benefits, the patient was deemed in                            satisfactory condition to undergo the procedure.                           After obtaining informed consent, the endoscope was                            passed under direct vision. Throughout the                            procedure, the patient's blood pressure, pulse, and                            oxygen saturations were monitored continuously. The                            Endoscope was introduced through the mouth, and                            advanced to the second part of duodenum. The upper  GI endoscopy was accomplished without difficulty.                            The patient tolerated the procedure well. Scope In: Scope Out: Findings:                 One benign-appearing, intrinsic mild stenosis was                            found 38 cm from the incisors. This stenosis                            measured 1 cm (in length). The stenosis was                            traversed. A TTS dilator was passed through the                            scope. Dilation with a 16-17-18 mm balloon dilator                            was performed to 17 mm. The dilation site was                            examined and showed mild mucosal disruption x2 and                            moderate improvement in luminal narrowing,                            consistent with successful dilation. Estimated                            blood loss was minimal.                           The upper third of the esophagus and middle third                            of the esophagus were normal.                            A single 6 mm submucosal papule (nodule) with no                            bleeding was found in the gastric fundus.                            Bite-on-bite biopsies were taken with a cold                            forceps for histology. Estimated blood loss was  minimal.                           Minimal inflammation characterized by erythema was                            found in the gastric body, at the incisura and in                            the gastric antrum. This was much improved compared                            with the previous EGD. Biopsies were taken with a                            cold forceps for Helicobacter pylori testing.                            Estimated blood loss was minimal.                           The examined duodenum was normal. Complications:            No immediate complications. Estimated Blood Loss:     Estimated blood loss was minimal. Impression:               - Benign-appearing esophageal stenosis. Dilated                            with 17 mm TTS balloon with appropriate mucosal                            rent x2, consistent with successful dilation.                           - Normal upper third of esophagus and middle third                            of esophagus.                           - A single submucosal papule (nodule) found in the                            stomach. Biopsied.                           - Gastritis. Overall much improved. Biopsied to                            evaluate for H pylori eradication.                           - Normal examined duodenum. Recommendation:           - Patient has a contact number available for  emergencies. The signs and symptoms of potential                            delayed complications were discussed with the                            patient. Return to normal activities tomorrow.                            Written  discharge instructions were provided to the                            patient.                           - Soft diet today and advance slowly as tolerated                            tomorrow per post dilation protocol.                           - Continue present medications.                           - Await pathology results.                           - Restart Protonix (pantoprazole) 40 mg PO daily to                            control reflux symptoms.Will plan to titrate to                            lowest effective dose.                           - Repeat upper endoscopy PRN for retreatment.                           - If biopsies of the gastric nodule are                            non-diagnostic, will refer for upper endoscopic                            ultrasound (UEUS).                           - Follow-up in the GI Clinic. Gerrit Heck, MD 01/02/2021 1:43:33 PM

## 2021-01-02 NOTE — Progress Notes (Signed)
GASTROENTEROLOGY PROCEDURE H&P NOTE   Primary Care Physician: Binnie Rail, MD    Reason for Procedure:   Dysphagia, esophageal stricture, H pylori gastritis, duodenitis  Plan:    EGD with dilation   Patient is appropriate for endoscopic procedure(s) in the ambulatory ([place]) setting.  The nature of the procedure, as well as the risks, benefits, and alternatives were carefully and thoroughly reviewed with the patient. Ample time for discussion and questions allowed. The patient understood, was satisfied, and agreed to proceed.     HPI: Benjamin Chen is a 55 y.o. male who presents for repeat EGD for evaluation of gastritis, duodenitis, and esophageal stricture and evaluate for eradication of H pylori.  EGD on 10/18/2020: Lower esophageal stricture, dilated with 15.5 mm TTS balloon, moderate H pylori gastritis, 6 mm submucosal nodule in fundus (path: chronic active gastritis with H pylori; no dysplasia), moderate duodenitis. Treated with Protonix 40 mg PO BID x8 weeks and presents today to evaluate for mucosal healing and repeat dilation as needed.    Past Medical History:  Diagnosis Date   Allergy    seasonal   Arthritis    Diabetes mellitus without complication (HCC)    GERD (gastroesophageal reflux disease)    occasional heartburn/no meds   Gout    Hyperlipidemia    Hypertension    Sleep apnea    Do not use it.    Past Surgical History:  Procedure Laterality Date   KNEE ARTHROSCOPY Right    right - torn meniscus   KNEE ARTHROSCOPY Left    left - torn meniscus   TOTAL KNEE ARTHROPLASTY Right 03/01/2020   Procedure: TOTAL KNEE ARTHROPLASTY;  Surgeon: Dorna Leitz, MD;  Location: [facility];  Service: Orthopedics;  Laterality: Right;   UPPER GASTROINTESTINAL ENDOSCOPY  10/18/2020    Prior to Admission medications   Medication Sig Start Date End Date Taking? Authorizing Provider  amLODipine (NORVASC) 5 MG tablet TAKE 1 TABLET BY MOUTH EVERY DAY 12/02/20  Yes Burns,  Claudina Lick, MD  JANUVIA 100 MG tablet TAKE 1 TABLET BY MOUTH EVERY DAY 05/13/20  Yes Burns, Claudina Lick, MD  JARDIANCE 10 MG TABS tablet TAKE 1 TABLET DAILY 04/01/20  Yes Binnie Rail, MD  metFORMIN (GLUCOPHAGE-XR) 750 MG 24 hr tablet TAKE 2 TABLETS (1,500 MG TOTAL) BY MOUTH DAILY WITH BREAKFAST. 06/03/20  Yes Burns, Claudina Lick, MD  pravastatin (PRAVACHOL) 10 MG tablet TAKE 1 TABLET BY MOUTH EVERY DAY 10/11/20  Yes Burns, Claudina Lick, MD  quinapril (ACCUPRIL) 20 MG tablet TAKE 1 TABLET BY MOUTH TWICE A DAY 12/18/20  Yes Burns, Claudina Lick, MD  spironolactone (ALDACTONE) 25 MG tablet TAKE 1 TABLET BY MOUTH EVERY DAY 06/07/20  Yes Burns, Claudina Lick, MD  Bismuth Subsalicylate (PEPTO-BISMOL) 262 MG TABS Take 2 tablets (524 mg total) by mouth QID. For 14 days Patient not taking: No sig reported 10/25/20   Yamilka Lopiccolo V, DO  pantoprazole (PROTONIX) 40 MG tablet Take 1 tablet (40 mg total) by mouth 2 (two) times daily. Start 40 mg twice a day x 8 weeks then reduce to 40 mg once a day. Patient not taking: Reported on 01/02/2021 10/18/20   Lavena Bullion, DO    Current Outpatient Medications  Medication Sig Dispense Refill   amLODipine (NORVASC) 5 MG tablet TAKE 1 TABLET BY MOUTH EVERY DAY 30 tablet 8   JANUVIA 100 MG tablet TAKE 1 TABLET BY MOUTH EVERY DAY 30 tablet 5   JARDIANCE 10 MG TABS  tablet TAKE 1 TABLET DAILY 30 tablet 8   metFORMIN (GLUCOPHAGE-XR) 750 MG 24 hr tablet TAKE 2 TABLETS (1,500 MG TOTAL) BY MOUTH DAILY WITH BREAKFAST. 60 tablet 5   pravastatin (PRAVACHOL) 10 MG tablet TAKE 1 TABLET BY MOUTH EVERY DAY 90 tablet 1   quinapril (ACCUPRIL) 20 MG tablet TAKE 1 TABLET BY MOUTH TWICE A DAY 60 tablet 5   spironolactone (ALDACTONE) 25 MG tablet TAKE 1 TABLET BY MOUTH EVERY DAY 30 tablet 5   Bismuth Subsalicylate (PEPTO-BISMOL) 262 MG TABS Take 2 tablets (524 mg total) by mouth QID. For 14 days (Patient not taking: No sig reported) 108 tablet 0   pantoprazole (PROTONIX) 40 MG tablet Take 1 tablet (40 mg total)  by mouth 2 (two) times daily. Start 40 mg twice a day x 8 weeks then reduce to 40 mg once a day. (Patient not taking: Reported on 01/02/2021) 90 tablet 3   Current Facility-Administered Medications  Medication Dose Route Frequency Provider Last Rate Last Admin   0.9 %  sodium chloride infusion  500 mL Intravenous Once Rithy Mandley V, DO        Allergies as of 01/02/2021 - Review Complete 01/02/2021  Allergen Reaction Noted   Allopurinol Palpitations 01/08/2020   Penicillins Swelling 12/30/2015    Family History  Problem Relation Age of Onset   Hyperlipidemia Mother    Hypertension Mother    Mental illness Father    Heart disease Maternal Aunt    Diabetes Maternal Grandmother    Heart disease Sister    Hypertension Sister    Thyroid disease Brother    Colon cancer Neg Hx    Esophageal cancer Neg Hx     Social History   Socioeconomic History   Marital status: Married    Spouse name: Not on file   Number of children: Not on file   Years of education: Not on file   Highest education level: Not on file  Occupational History   Not on file  Tobacco Use   Smoking status: Never   Smokeless tobacco: Never  Vaping Use   Vaping Use: Never used  Substance and Sexual Activity   Alcohol use: Yes    Alcohol/week: 2.0 standard drinks    Types: 2 Standard drinks or equivalent per week   Drug use: No   Sexual activity: Yes    Birth control/protection: None    Comment: Married  Other Topics Concern   Not on file  Social History Narrative   Not on file   Social Determinants of Health   Financial Resource Strain: Not on file  Food Insecurity: Not on file  Transportation Needs: Not on file  Physical Activity: Not on file  Stress: Not on file  Social Connections: Not on file  Intimate Partner Violence: Not on file    Physical Exam: Vital signs in last 24 hours: '@BP'$  (!) 125/92   Pulse 78   Temp (!) 97.3 F (36.3 C) (Skin)   Resp 10   Ht 6' 1.5" (1.867 m)   Wt 261  lb (118.4 kg)   SpO2 99%   BMI 33.97 kg/m  GEN: NAD EYE: Sclerae anicteric ENT: MMM CV: Non-tachycardic Pulm: CTA b/l GI: Soft, NT/ND NEURO:  Alert & Oriented x 3   Gerrit Heck, DO [facility]   01/02/2021 1:14 PM

## 2021-01-03 ENCOUNTER — Telehealth: Payer: Self-pay

## 2021-01-03 NOTE — Telephone Encounter (Signed)
Per 01/02/21 procedure report - Follow up in the GI clinic  Patient has been scheduled for a follow up with Dr. Bryan Lemma on Tuesday, 02/04/21 at 1:40 pm. Appt information sent to patient via my chart and a letter has been mailed.

## 2021-01-05 NOTE — Patient Instructions (Addendum)
  Blood work was ordered.     Shingles #2 vaccine given today.     Medications changes include :   none    Please followup in 6 months

## 2021-01-05 NOTE — Progress Notes (Signed)
Subjective:    Patient ID: Benjamin Chen, male    DOB: 11/23/1965, 55 y.o.   MRN: [id number]  HPI The patient is here for follow up of their chronic medical problems, including dm, htn, hichol  Occ electric shock in his left chest and left fingers.  He has some neck pain.  Lasts 1-2 seconds and occurs randomly.    Had recent egd and had gastritis and was h pylori pos. He completed the treatment for that and had another biopsy.  He is taking the GERD medication daily and it is helping.    He knows he needs to exercise more.    Medications and allergies reviewed with patient and updated if appropriate.  Patient Active Problem List   Diagnosis Date Noted   Status post total knee replacement, right 03/01/2020   Preop examination 01/08/2020   Osteoarthritis of knee 12/10/2019   Shortness of breath 07/03/2019   Palpitations 07/03/2019   GERD (gastroesophageal reflux disease) 07/03/2019   Low testosterone in male 01/18/2019   Fatty liver 03/27/2018   Cholelithiasis 03/27/2018   Fatigue 10/13/2017   Obesity, morbid ([place]) 01/12/2017   Chest pain 10/07/2016   Erectile dysfunction 10/07/2016   Gout 01/29/2016   Arthralgia 01/24/2016   Hyperlipidemia 01/24/2016   Essential hypertension, benign 12/30/2015   Diabetes ([place]) 12/30/2015   OSA (obstructive sleep apnea) 12/30/2015   Bilateral primary osteoarthritis of knee 12/30/2015   Joint pain 12/30/2015    Current Outpatient Medications on File Prior to Visit  Medication Sig Dispense Refill   amLODipine (NORVASC) 5 MG tablet TAKE 1 TABLET BY MOUTH EVERY DAY 30 tablet 8   JANUVIA 100 MG tablet TAKE 1 TABLET BY MOUTH EVERY DAY 30 tablet 5   JARDIANCE 10 MG TABS tablet TAKE 1 TABLET DAILY 30 tablet 8   metFORMIN (GLUCOPHAGE-XR) 750 MG 24 hr tablet TAKE 2 TABLETS (1,500 MG TOTAL) BY MOUTH DAILY WITH BREAKFAST. 60 tablet 5   pantoprazole (PROTONIX) 40 MG tablet Take 1 tablet (40 mg total) by mouth daily. 90 tablet 3   pravastatin  (PRAVACHOL) 10 MG tablet TAKE 1 TABLET BY MOUTH EVERY DAY 90 tablet 1   quinapril (ACCUPRIL) 20 MG tablet TAKE 1 TABLET BY MOUTH TWICE A DAY 60 tablet 5   spironolactone (ALDACTONE) 25 MG tablet TAKE 1 TABLET BY MOUTH EVERY DAY 30 tablet 5   No current facility-administered medications on file prior to visit.    Past Medical History:  Diagnosis Date   Allergy    seasonal   Arthritis    Diabetes mellitus without complication (HCC)    GERD (gastroesophageal reflux disease)    occasional heartburn/no meds   Gout    Hyperlipidemia    Hypertension    Sleep apnea    Do not use it.    Past Surgical History:  Procedure Laterality Date   KNEE ARTHROSCOPY Right    right - torn meniscus   KNEE ARTHROSCOPY Left    left - torn meniscus   TOTAL KNEE ARTHROPLASTY Right 03/01/2020   Procedure: TOTAL KNEE ARTHROPLASTY;  Surgeon: Dorna Leitz, MD;  Location: [facility];  Service: Orthopedics;  Laterality: Right;   UPPER GASTROINTESTINAL ENDOSCOPY  10/18/2020    Social History   Socioeconomic History   Marital status: Married    Spouse name: Not on file   Number of children: Not on file   Years of education: Not on file   Highest education level: Not on file  Occupational History  Not on file  Tobacco Use   Smoking status: Never   Smokeless tobacco: Never  Vaping Use   Vaping Use: Never used  Substance and Sexual Activity   Alcohol use: Yes    Alcohol/week: 2.0 standard drinks    Types: 2 Standard drinks or equivalent per week   Drug use: No   Sexual activity: Yes    Birth control/protection: None    Comment: Married  Other Topics Concern   Not on file  Social History Narrative   Not on file   Social Determinants of Health   Financial Resource Strain: Not on file  Food Insecurity: Not on file  Transportation Needs: Not on file  Physical Activity: Not on file  Stress: Not on file  Social Connections: Not on file    Family History  Problem Relation Age of Onset    Hyperlipidemia Mother    Hypertension Mother    Mental illness Father    Heart disease Maternal Aunt    Diabetes Maternal Grandmother    Heart disease Sister    Hypertension Sister    Thyroid disease Brother    Colon cancer Neg Hx    Esophageal cancer Neg Hx     Review of Systems  Constitutional:  Negative for chills and fever.  Respiratory:  Positive for shortness of breath (occ). Negative for cough and wheezing.   Cardiovascular:  Negative for chest pain, palpitations and leg swelling.  Musculoskeletal:  Positive for neck pain.  Neurological:  Positive for light-headedness (occ). Negative for headaches (occ - related to neck).      Objective:   Vitals:   01/06/21 1358  BP: 118/82  Pulse: 82  Temp: 98.2 F (36.8 C)  SpO2: 96%   BP Readings from Last 3 Encounters:  01/06/21 118/82  01/02/21 (!) 131/91  10/18/20 129/85   Wt Readings from Last 3 Encounters:  01/06/21 266 lb (120.7 kg)  01/02/21 261 lb (118.4 kg)  12/11/20 261 lb (118.4 kg)   Body mass index is 34.62 kg/m.   Physical Exam    Constitutional: Appears well-developed and well-nourished. No distress.  HENT:  Head: Normocephalic and atraumatic.  Neck: Neck supple. No tracheal deviation present. No thyromegaly present.  No cervical lymphadenopathy Cardiovascular: Normal rate, regular rhythm and normal heart sounds.   No murmur heard. No carotid bruit .  No edema Pulmonary/Chest: Effort normal and breath sounds normal. No respiratory distress. No has no wheezes. No rales.  Skin: Skin is warm and dry. Not diaphoretic.  Psychiatric: Normal mood and affect. Behavior is normal.      Assessment & Plan:    See Problem List for Assessment and Plan of chronic medical problems.    This visit occurred during the SARS-CoV-2 public health emergency.  Safety protocols were in place, including screening questions prior to the visit, additional usage of staff PPE, and extensive cleaning of exam room while  observing appropriate contact time as indicated for disinfecting solutions.

## 2021-01-06 ENCOUNTER — Encounter: Payer: Self-pay | Admitting: Internal Medicine

## 2021-01-06 ENCOUNTER — Telehealth: Payer: Self-pay

## 2021-01-06 ENCOUNTER — Ambulatory Visit: Payer: Commercial Managed Care - PPO | Admitting: Internal Medicine

## 2021-01-06 ENCOUNTER — Other Ambulatory Visit: Payer: Self-pay

## 2021-01-06 VITALS — BP 118/82 | HR 82 | Temp 98.2°F | Ht 73.5 in | Wt 266.0 lb

## 2021-01-06 DIAGNOSIS — E782 Mixed hyperlipidemia: Secondary | ICD-10-CM

## 2021-01-06 DIAGNOSIS — R079 Chest pain, unspecified: Secondary | ICD-10-CM

## 2021-01-06 DIAGNOSIS — Z23 Encounter for immunization: Secondary | ICD-10-CM | POA: Diagnosis not present

## 2021-01-06 DIAGNOSIS — E1169 Type 2 diabetes mellitus with other specified complication: Secondary | ICD-10-CM | POA: Diagnosis not present

## 2021-01-06 DIAGNOSIS — Z1159 Encounter for screening for other viral diseases: Secondary | ICD-10-CM | POA: Diagnosis not present

## 2021-01-06 DIAGNOSIS — I10 Essential (primary) hypertension: Secondary | ICD-10-CM | POA: Diagnosis not present

## 2021-01-06 LAB — HEMOGLOBIN A1C: Hgb A1c MFr Bld: 7.1 % — ABNORMAL HIGH (ref 4.6–6.5)

## 2021-01-06 LAB — COMPREHENSIVE METABOLIC PANEL
ALT: 18 U/L (ref 0–53)
AST: 19 U/L (ref 0–37)
Albumin: 4.2 g/dL (ref 3.5–5.2)
Alkaline Phosphatase: 84 U/L (ref 39–117)
BUN: 14 mg/dL (ref 6–23)
CO2: 28 mEq/L (ref 19–32)
Calcium: 10 mg/dL (ref 8.4–10.5)
Chloride: 100 mEq/L (ref 96–112)
Creatinine, Ser: 1.12 mg/dL (ref 0.40–1.50)
GFR: 74.89 mL/min (ref >60.00)
Glucose, Bld: 95 mg/dL (ref 70–99)
Potassium: 3.8 mEq/L (ref 3.5–5.1)
Sodium: 136 mEq/L (ref 135–145)
Total Bilirubin: 0.9 mg/dL (ref 0.2–1.2)
Total Protein: 7.3 g/dL (ref 6.0–8.3)

## 2021-01-06 NOTE — Assessment & Plan Note (Signed)
Chronic Lab Results  Component Value Date   HGBA1C 7.1 (H) 01/06/2021   Not ideally controlled Continue jardiance 10 mg daily - will discuss with him increasing to 25 mg  Continue januvia 100 mg qd and metformin xr 1500 mg daily

## 2021-01-06 NOTE — Assessment & Plan Note (Addendum)
Chronic BP well controlled Continue norvasc 5 mg qd, accupril 20 mg bid, spironolactone 25 mg qd cmp

## 2021-01-06 NOTE — Assessment & Plan Note (Signed)
Acute  Electric like pain in left chest and left fingers - lasts 1-2 sec Chest pain is atypical  - likely neuralgia Had normal stress test 2018 and normal cath several years ago Likely related to chronic neck issues

## 2021-01-06 NOTE — Assessment & Plan Note (Signed)
Chronic Continue pravastatin 10 mg hs Regular exercise and healthy diet encouraged

## 2021-01-06 NOTE — Telephone Encounter (Signed)
  Follow up Call-  Call back number 01/02/2021 10/18/2020  Post procedure Call Back phone  # [phone]  Permission to leave phone message Yes Yes  Some recent data might be hidden     Patient questions:  Do you have a fever, pain , or abdominal swelling? No. Pain Score  0 *  Have you tolerated food without any problems? Yes.    Have you been able to return to your normal activities? Yes.    Do you have any questions about your discharge instructions: Diet   No. Medications  No. Follow up visit  No.  Do you have questions or concerns about your Care? No.  Actions: * If pain score is 4 or above: No action needed, pain <4.

## 2021-01-07 LAB — HEPATITIS C ANTIBODY
Hepatitis C Ab: NONREACTIVE
SIGNAL TO CUT-OFF: 0.01 (ref <1.00)

## 2021-01-08 ENCOUNTER — Encounter: Payer: Self-pay | Admitting: Gastroenterology

## 2021-01-08 ENCOUNTER — Other Ambulatory Visit: Payer: Self-pay | Admitting: Internal Medicine

## 2021-01-30 ENCOUNTER — Telehealth: Payer: Self-pay | Admitting: Gastroenterology

## 2021-01-30 NOTE — Telephone Encounter (Signed)
Lm on vm for patient to return call 

## 2021-01-30 NOTE — Telephone Encounter (Signed)
This is a Dr C pt.  Will send to [facility].

## 2021-01-31 ENCOUNTER — Other Ambulatory Visit: Payer: Self-pay

## 2021-01-31 DIAGNOSIS — K3189 Other diseases of stomach and duodenum: Secondary | ICD-10-CM

## 2021-01-31 NOTE — Telephone Encounter (Signed)
I had referred patient for EUS for evaluation of gastric submucosal nodule with benign biopsies x2.  Case was reviewed by Dr. Ardis Hughs as well, who agrees with EUS.  Sorry, I think I buried that review is in the pathology results section, so likely why it was a little difficult to find.  Thanks!

## 2021-01-31 NOTE — Telephone Encounter (Signed)
The pt has been scheduled for EUS on 10/27 at 830 am at [facility] with DJ for gastric lesion

## 2021-01-31 NOTE — Telephone Encounter (Signed)
Spoke with patient in regards to his pathology results and recommendations. Pt is aware that I will give him a call back with further recommendations. He verbalized understanding and had no concerns at the end of the call.  Dr. Bryan Lemma, who are you referring patient to for EUS? Dr. Rush Landmark or Dr. Ardis Hughs? I do not see any correspondences. Please advise, thanks.

## 2021-01-31 NOTE — Telephone Encounter (Signed)
Left message on machine to call back  

## 2021-01-31 NOTE — Telephone Encounter (Signed)
Error the appt time is 845 am due to [facility] clean up time

## 2021-02-03 NOTE — Telephone Encounter (Signed)
Got it. Looks like he scheduled with you DJ. Thanks. GM

## 2021-02-03 NOTE — Telephone Encounter (Signed)
EUS scheduled, pt instructed and medications reviewed.  Patient instructions mailed to home.  Patient to call with any questions or concerns.  

## 2021-02-04 ENCOUNTER — Ambulatory Visit: Payer: Commercial Managed Care - PPO | Admitting: Gastroenterology

## 2021-02-18 ENCOUNTER — Ambulatory Visit: Payer: Commercial Managed Care - PPO | Admitting: Gastroenterology

## 2021-02-24 ENCOUNTER — Encounter: Payer: Self-pay | Admitting: Gastroenterology

## 2021-02-24 ENCOUNTER — Ambulatory Visit (INDEPENDENT_AMBULATORY_CARE_PROVIDER_SITE_OTHER): Payer: Commercial Managed Care - PPO | Admitting: Gastroenterology

## 2021-02-24 ENCOUNTER — Other Ambulatory Visit: Payer: Self-pay

## 2021-02-24 VITALS — BP 118/86 | HR 93 | Ht 73.5 in | Wt 270.4 lb

## 2021-02-24 DIAGNOSIS — Z7189 Other specified counseling: Secondary | ICD-10-CM

## 2021-02-24 DIAGNOSIS — Z860100 Personal history of colon polyps, unspecified: Secondary | ICD-10-CM

## 2021-02-24 DIAGNOSIS — K219 Gastro-esophageal reflux disease without esophagitis: Secondary | ICD-10-CM | POA: Diagnosis not present

## 2021-02-24 DIAGNOSIS — K3189 Other diseases of stomach and duodenum: Secondary | ICD-10-CM

## 2021-02-24 DIAGNOSIS — Z8619 Personal history of other infectious and parasitic diseases: Secondary | ICD-10-CM

## 2021-02-24 NOTE — Progress Notes (Signed)
Chief Complaint:    GERD  GI History: 55 year old male with a history of colon polyps, hyperlipidemia, obesity (BMI 34), DM, HTN, and OSA, initially seen by me 07/2019 for evaluation of dysphagia reflux.   1) Intermittent solid food dysphagia without odynophagia or weight loss.  Points to suprasternal notch. Resolved with EGD with dilation in June and Aug 2022.   2) GERD: Index symptoms of heartburn, regurgitation. Worse with spicy, tomato based sauces, chocolate, supine. Has eliminated caffeine.  Negative Cardiac work-up for NCCP. Well controlled with Protonix 40 mg/day.    3) Hepatic steatosis: Steatosis noted on CT and ultrasound in 2019. - 06/2020: Normal liver enzymes   Endoscopic Hx: - Colonoscopy (01/2018, Dr. Bryan Lemma): 5 polyps in the ascending, transverse, descending, sigmoid (path: Tubular Adenomas), diminutive rectal hyperplastic polyps, internal hemorrhoids.  Recommended repeat in 3 years - EGD (10/2020, Dr. Bryan Lemma): Lower esophageal stricture, dilated with 15.5 mm TTS balloon, moderate H pylori gastritis, 6 mm submucosal nodule in fundus (path: chronic active gastritis with H pylori; no dysplasia), moderate duodenitis. Treated with Protonix 40 mg PO BID x8 weeks with clinical improvement. He reduced to 40 mg/day with continued control of reflux, but breakthrough this week as he held PPI x7 days for procedure today - Colonoscopy (10/2020, Dr. Bryan Lemma): 5 mm sigmoid hyperplastic polyp, 2 benign rectal/rectosigmoid polyps, grade 2 hemorrhoids.  Normal TI.  Repeat in 5 years due to prior adenomatous polyps - EGD (12/2020, Dr. Bryan Lemma): Stenosis at 38 cm dilated 17 mm TTS balloon, 6 mm submucosal nodule in the fundus (path: Chronic gastritis), Minimal gastritis (path: Benign), normal duodenum   - CT 03/2018: Hepatic steatosis - Abd [place] 03/2018: GB sludge and stones without cholecystitis, fatty liver  HPI:     Patient is a 55 y.o. male presenting to the [facility]  for follow-up.  Last seen by me on 09/04/2020 for continued evaluation/treatment of reflux (he had stopped taking Prilosec).  Had recommended resuming Prilosec and completed EGD and colonoscopy as outlined above.  EGD in 10/2020 with H. pylori gastritis and gastric nodule, treated with quadruple therapy and 8 weeks of high-dose PPI.  Had good control of reflux at Protonix 40 mg/day, then breakthrough when he held PPI x7 days for repeat procedure in 12/2020.  Gastritis much improved on repeat EGD, and referred for EUS for evaluation of nodule in gastric fundus.  Today, he states reflux well controlled with Protonix 40 mg/day.  Dysphagia has resolved.  Scheduled for EUS with Dr. Ardis Hughs on 10/27.  He is otherwise without any complaints today.   Review of systems:     No chest pain, no SOB, no fevers, no urinary sx   Past Medical History:  Diagnosis Date   Allergy    seasonal   Arthritis    Diabetes mellitus without complication (HCC)    GERD (gastroesophageal reflux disease)    occasional heartburn/no meds   Gout    Hyperlipidemia    Hypertension    Sleep apnea    Do not use it.    Patient's surgical history, family medical history, social history, medications and allergies were all reviewed in Epic    Current Outpatient Medications  Medication Sig Dispense Refill   amLODipine (NORVASC) 5 MG tablet TAKE 1 TABLET BY MOUTH EVERY DAY 30 tablet 8   JANUVIA 100 MG tablet TAKE 1 TABLET BY MOUTH EVERY DAY 30 tablet 5   JARDIANCE 10 MG TABS tablet TAKE 1 TABLET DAILY 30 tablet 8   metFORMIN (  GLUCOPHAGE-XR) 750 MG 24 hr tablet TAKE 2 TABLETS (1,500 MG TOTAL) BY MOUTH DAILY WITH BREAKFAST. 60 tablet 5   pantoprazole (PROTONIX) 40 MG tablet Take 1 tablet (40 mg total) by mouth daily. 90 tablet 3   pravastatin (PRAVACHOL) 10 MG tablet TAKE 1 TABLET BY MOUTH EVERY DAY 90 tablet 1   quinapril (ACCUPRIL) 20 MG tablet TAKE 1 TABLET BY MOUTH TWICE A DAY 60 tablet 5   spironolactone (ALDACTONE) 25 MG  tablet TAKE 1 TABLET BY MOUTH EVERY DAY 30 tablet 5   No current facility-administered medications for this visit.    Physical Exam:     BP 118/86   Pulse 93   Ht 6' 1.5" (1.867 m)   Wt 270 lb 6 oz (122.6 kg)   SpO2 99%   BMI 35.19 kg/m   GENERAL:  Pleasant male in NAD PSYCH: : Cooperative, normal affect Musculoskeletal:  Normal muscle tone, normal strength NEURO: Alert and oriented x 3, no focal neurologic deficits   IMPRESSION and PLAN:    1) GERD - Well controlled on current therapy. - Discussed risks, benefits, alternatives of continued acid suppression therapy.  Plan to titrate as below -Decrease Protonix 20 mg/day and his symptoms still well controlled, continue to wean or potentially stop if no symptomatic recurrence with weaning - Continue antireflux lifestyle/dietary modifications  2) Medication counseling -I have reviewed the indications, risks, and benefits of PPI therapy with the patient today. I have discussed studies that raise ? of increased osteoporosis, dementia, and kidney failure and explained that these studies show very weak associations of unclear significance and not clear cause and effect. We did discuss the potential for vitamin malabsorption, to include magnesium (very rare), calcium (easily modifiable with Calcium Citrate supplement), vitamin B12 (again, correctable with oral B12 supplement), and iron (although rarely clinically significant outside patients who require iron supplementation previously), and can monitor each of these periodically with routine labs. We have agreed to continue PPI treatment in this case.  3) Gastric nodule - Scheduled for EUS with Dr. Ardis Hughs next week  4) History of colon polyps - Repeat colonoscopy in 2027 for ongoing polyp surveillance  5) History of H. pylori gastritis - Completed quadruple therapy and confirmation of eradication with repeat upper endoscopy.  No further work-up needed at this time  RTC in 6 months  or sooner as needed  Lavena Bullion ,DO, FACG 02/24/2021, 9:48 AM

## 2021-02-24 NOTE — Patient Instructions (Signed)
If you are age 54 or older, your body mass index should be between 23-30. Your Body mass index is 35.19 kg/m. If this is out of the aforementioned range listed, please consider follow up with your Primary Care Provider.  If you are age 36 or younger, your body mass index should be between 19-25. Your Body mass index is 35.19 kg/m. If this is out of the aformentioned range listed, please consider follow up with your Primary Care Provider.   __________________________________________________________  The [facility] providers would like to encourage you to use [facility] to communicate with providers for non-urgent requests or questions.  Due to long hold times on the telephone, sending your provider a message by [facility] may be a faster and more efficient way to get a response.  Please allow 48 business hours for a response.  Please remember that this is for non-urgent requests.    Please decrease Protonix to 20mg  daily.  Follow up in 6 months.  Thank you for choosing me and [facility].  Vito Cirigliano, D.O.

## 2021-02-24 NOTE — H&P (View-Only) (Signed)
Chief Complaint:    GERD  GI History: 55 year old male with a history of colon polyps, hyperlipidemia, obesity (BMI 34), DM, HTN, and OSA, initially seen by me 07/2019 for evaluation of dysphagia reflux.   1) Intermittent solid food dysphagia without odynophagia or weight loss.  Points to suprasternal notch. Resolved with EGD with dilation in June and Aug 2022.   2) GERD: Index symptoms of heartburn, regurgitation. Worse with spicy, tomato based sauces, chocolate, supine. Has eliminated caffeine.  Negative Cardiac work-up for NCCP. Well controlled with Protonix 40 mg/day.    3) Hepatic steatosis: Steatosis noted on CT and ultrasound in 2019. - 06/2020: Normal liver enzymes   Endoscopic Hx: - Colonoscopy (01/2018, Dr. Bryan Lemma): 5 polyps in the ascending, transverse, descending, sigmoid (path: Tubular Adenomas), diminutive rectal hyperplastic polyps, internal hemorrhoids.  Recommended repeat in 3 years - EGD (10/2020, Dr. Bryan Lemma): Lower esophageal stricture, dilated with 15.5 mm TTS balloon, moderate H pylori gastritis, 6 mm submucosal nodule in fundus (path: chronic active gastritis with H pylori; no dysplasia), moderate duodenitis. Treated with Protonix 40 mg PO BID x8 weeks with clinical improvement. He reduced to 40 mg/day with continued control of reflux, but breakthrough this week as he held PPI x7 days for procedure today - Colonoscopy (10/2020, Dr. Bryan Lemma): 5 mm sigmoid hyperplastic polyp, 2 benign rectal/rectosigmoid polyps, grade 2 hemorrhoids.  Normal TI.  Repeat in 5 years due to prior adenomatous polyps - EGD (12/2020, Dr. Bryan Lemma): Stenosis at 38 cm dilated 17 mm TTS balloon, 6 mm submucosal nodule in the fundus (path: Chronic gastritis), Minimal gastritis (path: Benign), normal duodenum   - CT 03/2018: Hepatic steatosis - Abd [place] 03/2018: GB sludge and stones without cholecystitis, fatty liver  HPI:     Patient is a 55 y.o. male presenting to the Gastroenterology Clinic  for follow-up.  Last seen by me on 09/04/2020 for continued evaluation/treatment of reflux (he had stopped taking Prilosec).  Had recommended resuming Prilosec and completed EGD and colonoscopy as outlined above.  EGD in 10/2020 with H. pylori gastritis and gastric nodule, treated with quadruple therapy and 8 weeks of high-dose PPI.  Had good control of reflux at Protonix 40 mg/day, then breakthrough when he held PPI x7 days for repeat procedure in 12/2020.  Gastritis much improved on repeat EGD, and referred for EUS for evaluation of nodule in gastric fundus.  Today, he states reflux well controlled with Protonix 40 mg/day.  Dysphagia has resolved.  Scheduled for EUS with Dr. Ardis Hughs on 10/27.  He is otherwise without any complaints today.   Review of systems:     No chest pain, no SOB, no fevers, no urinary sx   Past Medical History:  Diagnosis Date   Allergy    seasonal   Arthritis    Diabetes mellitus without complication (HCC)    GERD (gastroesophageal reflux disease)    occasional heartburn/no meds   Gout    Hyperlipidemia    Hypertension    Sleep apnea    Do not use it.    Patient's surgical history, family medical history, social history, medications and allergies were all reviewed in Epic    Current Outpatient Medications  Medication Sig Dispense Refill   amLODipine (NORVASC) 5 MG tablet TAKE 1 TABLET BY MOUTH EVERY DAY 30 tablet 8   JANUVIA 100 MG tablet TAKE 1 TABLET BY MOUTH EVERY DAY 30 tablet 5   JARDIANCE 10 MG TABS tablet TAKE 1 TABLET DAILY 30 tablet 8   metFORMIN (  GLUCOPHAGE-XR) 750 MG 24 hr tablet TAKE 2 TABLETS (1,500 MG TOTAL) BY MOUTH DAILY WITH BREAKFAST. 60 tablet 5   pantoprazole (PROTONIX) 40 MG tablet Take 1 tablet (40 mg total) by mouth daily. 90 tablet 3   pravastatin (PRAVACHOL) 10 MG tablet TAKE 1 TABLET BY MOUTH EVERY DAY 90 tablet 1   quinapril (ACCUPRIL) 20 MG tablet TAKE 1 TABLET BY MOUTH TWICE A DAY 60 tablet 5   spironolactone (ALDACTONE) 25 MG  tablet TAKE 1 TABLET BY MOUTH EVERY DAY 30 tablet 5   No current facility-administered medications for this visit.    Physical Exam:     BP 118/86   Pulse 93   Ht 6' 1.5" (1.867 m)   Wt 270 lb 6 oz (122.6 kg)   SpO2 99%   BMI 35.19 kg/m   GENERAL:  Pleasant male in NAD PSYCH: : Cooperative, normal affect Musculoskeletal:  Normal muscle tone, normal strength NEURO: Alert and oriented x 3, no focal neurologic deficits   IMPRESSION and PLAN:    1) GERD - Well controlled on current therapy. - Discussed risks, benefits, alternatives of continued acid suppression therapy.  Plan to titrate as below -Decrease Protonix 20 mg/day and his symptoms still well controlled, continue to wean or potentially stop if no symptomatic recurrence with weaning - Continue antireflux lifestyle/dietary modifications  2) Medication counseling -I have reviewed the indications, risks, and benefits of PPI therapy with the patient today. I have discussed studies that raise ? of increased osteoporosis, dementia, and kidney failure and explained that these studies show very weak associations of unclear significance and not clear cause and effect. We did discuss the potential for vitamin malabsorption, to include magnesium (very rare), calcium (easily modifiable with Calcium Citrate supplement), vitamin B12 (again, correctable with oral B12 supplement), and iron (although rarely clinically significant outside patients who require iron supplementation previously), and can monitor each of these periodically with routine labs. We have agreed to continue PPI treatment in this case.  3) Gastric nodule - Scheduled for EUS with Dr. Ardis Hughs next week  4) History of colon polyps - Repeat colonoscopy in 2027 for ongoing polyp surveillance  5) History of H. pylori gastritis - Completed quadruple therapy and confirmation of eradication with repeat upper endoscopy.  No further work-up needed at this time  RTC in 6 months  or sooner as needed  Lavena Bullion ,DO, FACG 02/24/2021, 9:48 AM

## 2021-02-25 ENCOUNTER — Encounter (HOSPITAL_COMMUNITY): Payer: Self-pay | Admitting: Gastroenterology

## 2021-02-26 ENCOUNTER — Other Ambulatory Visit: Payer: Self-pay | Admitting: Internal Medicine

## 2021-03-04 ENCOUNTER — Telehealth: Payer: Self-pay

## 2021-03-04 ENCOUNTER — Encounter: Payer: Self-pay | Admitting: Gastroenterology

## 2021-03-04 NOTE — Telephone Encounter (Signed)
The pt appt for 10/27 will need to be moved to another day to accommodate an urgent case.  Left message on machine to call back

## 2021-03-04 NOTE — Telephone Encounter (Signed)
The pt has been added for 03/13/21 at 1045 am at [facility] with DJ.  The pt has been instructed and will call with any concerns

## 2021-03-12 ENCOUNTER — Telehealth: Payer: Self-pay

## 2021-03-12 NOTE — Telephone Encounter (Signed)
I spoke with [facility] and Lattie Haw looked at the audit trail and the scheduler Kim added the injection as an edit.  I believe this was in error and had her remove that part of the case.

## 2021-03-12 NOTE — Telephone Encounter (Signed)
I am not sure either I looked in the chart and did not see anything mentioned about it.  I am not sure how the order was entered on the case.  I wanted to make sure I was not missing something.

## 2021-03-12 NOTE — Telephone Encounter (Signed)
Dr Ardis Hughs the pt has a note on the upcoming procedure that says he needs a steroid injection in the esophageal junction.  [facility] is calling for the order. They want to make sure that they have what you need.  Please advise

## 2021-03-13 ENCOUNTER — Ambulatory Visit (HOSPITAL_COMMUNITY): Payer: Commercial Managed Care - PPO | Admitting: Anesthesiology

## 2021-03-13 ENCOUNTER — Encounter (HOSPITAL_COMMUNITY)
Admission: RE | Disposition: A | Discharge status: Home / Self Care | Payer: Self-pay | Source: Ambulatory Visit | Attending: Gastroenterology

## 2021-03-13 ENCOUNTER — Other Ambulatory Visit: Payer: Self-pay

## 2021-03-13 ENCOUNTER — Ambulatory Visit (HOSPITAL_COMMUNITY)
Admission: RE | Admit: 2021-03-13 | Discharge: 2021-03-13 | Disposition: A | Discharge status: Home / Self Care | Payer: Commercial Managed Care - PPO | Source: Ambulatory Visit | Attending: Gastroenterology | Admitting: Gastroenterology

## 2021-03-13 ENCOUNTER — Encounter (HOSPITAL_COMMUNITY): Payer: Self-pay | Admitting: Gastroenterology

## 2021-03-13 DIAGNOSIS — K2950 Unspecified chronic gastritis without bleeding: Secondary | ICD-10-CM | POA: Insufficient documentation

## 2021-03-13 DIAGNOSIS — K219 Gastro-esophageal reflux disease without esophagitis: Secondary | ICD-10-CM | POA: Diagnosis not present

## 2021-03-13 DIAGNOSIS — Z86010 Personal history of colonic polyps: Secondary | ICD-10-CM | POA: Insufficient documentation

## 2021-03-13 DIAGNOSIS — K3189 Other diseases of stomach and duodenum: Secondary | ICD-10-CM | POA: Diagnosis not present

## 2021-03-13 DIAGNOSIS — K2970 Gastritis, unspecified, without bleeding: Secondary | ICD-10-CM | POA: Diagnosis not present

## 2021-03-13 DIAGNOSIS — E785 Hyperlipidemia, unspecified: Secondary | ICD-10-CM | POA: Insufficient documentation

## 2021-03-13 DIAGNOSIS — Z7984 Long term (current) use of oral hypoglycemic drugs: Secondary | ICD-10-CM | POA: Insufficient documentation

## 2021-03-13 DIAGNOSIS — R1310 Dysphagia, unspecified: Secondary | ICD-10-CM | POA: Insufficient documentation

## 2021-03-13 DIAGNOSIS — G4733 Obstructive sleep apnea (adult) (pediatric): Secondary | ICD-10-CM | POA: Insufficient documentation

## 2021-03-13 DIAGNOSIS — I10 Essential (primary) hypertension: Secondary | ICD-10-CM | POA: Insufficient documentation

## 2021-03-13 DIAGNOSIS — Z79899 Other long term (current) drug therapy: Secondary | ICD-10-CM | POA: Diagnosis not present

## 2021-03-13 DIAGNOSIS — Z6835 Body mass index (BMI) 35.0-35.9, adult: Secondary | ICD-10-CM | POA: Insufficient documentation

## 2021-03-13 DIAGNOSIS — K760 Fatty (change of) liver, not elsewhere classified: Secondary | ICD-10-CM | POA: Diagnosis not present

## 2021-03-13 DIAGNOSIS — E119 Type 2 diabetes mellitus without complications: Secondary | ICD-10-CM | POA: Insufficient documentation

## 2021-03-13 DIAGNOSIS — E669 Obesity, unspecified: Secondary | ICD-10-CM | POA: Insufficient documentation

## 2021-03-13 HISTORY — PX: EUS: SHX5427

## 2021-03-13 HISTORY — PX: ESOPHAGOGASTRODUODENOSCOPY: SHX5428

## 2021-03-13 HISTORY — PX: BIOPSY: SHX5522

## 2021-03-13 LAB — GLUCOSE, CAPILLARY: Glucose-Capillary: 100 mg/dL — ABNORMAL HIGH (ref 70–99)

## 2021-03-13 SURGERY — UPPER ENDOSCOPIC ULTRASOUND (EUS) RADIAL
Anesthesia: Monitor Anesthesia Care

## 2021-03-13 MED ORDER — PROPOFOL 500 MG/50ML IV EMUL
INTRAVENOUS | Status: DC | PRN
  Administered 2021-03-13: 150 ug/kg/min via INTRAVENOUS

## 2021-03-13 MED ORDER — LACTATED RINGERS IV SOLN: INTRAVENOUS | Status: DC

## 2021-03-13 MED ORDER — LIDOCAINE 2% (20 MG/ML) 5 ML SYRINGE
INTRAMUSCULAR | Status: DC | PRN
  Administered 2021-03-13: 100 mg via INTRAVENOUS

## 2021-03-13 MED ORDER — LACTATED RINGERS IV SOLN
INTRAVENOUS | Status: AC | PRN
  Administered 2021-03-13: 1000 mL via INTRAVENOUS

## 2021-03-13 MED ORDER — PROPOFOL 10 MG/ML IV BOLUS
INTRAVENOUS | Status: DC | PRN
  Administered 2021-03-13 (×2): 50 mg via INTRAVENOUS

## 2021-03-13 MED ORDER — SODIUM CHLORIDE 0.9 % IV SOLN: INTRAVENOUS | Status: DC

## 2021-03-13 NOTE — Interval H&P Note (Signed)
History and Physical Interval Note:  03/13/2021 10:17 AM  Benjamin Chen  has presented today for surgery, with the diagnosis of gastric nodule.  The various methods of treatment have been discussed with the patient and family. After consideration of risks, benefits and other options for treatment, the patient has consented to  Procedure(s): UPPER ENDOSCOPIC ULTRASOUND (EUS) RADIAL WITH DIALATION (N/A) as a surgical intervention.  The patient's history has been reviewed, patient examined, no change in status, stable for surgery.  I have reviewed the patient's chart and labs.  Questions were answered to the patient's satisfaction.     Milus Banister

## 2021-03-13 NOTE — Transfer of Care (Signed)
Immediate Anesthesia Transfer of Care Note  Patient: Benjamin Chen  Procedure(s) Performed: UPPER ENDOSCOPIC ULTRASOUND (EUS) RADIAL WITH DIALATION BIOPSY ESOPHAGOGASTRODUODENOSCOPY (EGD)  Patient Location: PACU and Endoscopy Unit  Anesthesia Type:MAC  Level of Consciousness: drowsy  Airway & Oxygen Therapy: Patient Spontanous Breathing  Post-op Assessment: Report given to RN and Post -op Vital signs reviewed and stable  Post vital signs: Reviewed and stable  Last Vitals:  Vitals Value Taken Time  BP 154/114 03/13/21 1221  Temp    Pulse 92 03/13/21 1223  Resp 17 03/13/21 1223  SpO2 97 % 03/13/21 1223  Vitals shown include unvalidated device data.  Last Pain:  Vitals:   03/13/21 1004  TempSrc: Oral  PainSc: 0-No pain         Complications: No notable events documented.

## 2021-03-13 NOTE — Discharge Instructions (Signed)
YOU HAD AN ENDOSCOPIC PROCEDURE TODAY: Refer to the procedure report and other information in the discharge instructions given to you for any specific questions about what was found during the examination. If this information does not answer your questions, please call [place] office at [phone] to clarify.   YOU SHOULD EXPECT: Some feelings of bloating in the abdomen. Passage of more gas than usual. Walking can help get rid of the air that was put into your GI tract during the procedure and reduce the bloating. If you had a lower endoscopy (such as a colonoscopy or flexible sigmoidoscopy) you may notice spotting of blood in your stool or on the toilet paper. Some abdominal soreness may be present for a day or two, also.  DIET: Your first meal following the procedure should be a light meal and then it is ok to progress to your normal diet. A half-sandwich or bowl of soup is an example of a good first meal. Heavy or fried foods are harder to digest and may make you feel nauseous or bloated. Drink plenty of fluids but you should avoid alcoholic beverages for 24 hours. If you had a esophageal dilation, please see attached instructions for diet.    ACTIVITY: Your care partner should take you home directly after the procedure. You should plan to take it easy, moving slowly for the rest of the day. You can resume normal activity the day after the procedure however YOU SHOULD NOT DRIVE, use power tools, machinery or perform tasks that involve climbing or major physical exertion for 24 hours (because of the sedation medicines used during the test).   SYMPTOMS TO REPORT IMMEDIATELY: A gastroenterologist can be reached at any hour. Please call [phone]  for any of the following symptoms:   Following upper endoscopy (EGD, EUS, ERCP, esophageal dilation) Vomiting of blood or coffee ground material  New, significant abdominal pain  New, significant chest pain or pain under the shoulder blades  Painful or  persistently difficult swallowing  New shortness of breath  Black, tarry-looking or red, bloody stools  FOLLOW UP:  If any biopsies were taken you will be contacted by phone or by letter within the next 1-3 weeks. Call [phone]  if you have not heard about the biopsies in 3 weeks.  Please also call with any specific questions about appointments or follow up tests.  

## 2021-03-13 NOTE — Op Note (Signed)
[facility] Patient Name: Benjamin Chen Procedure Date: 03/13/2021 MRN: [id number] Attending MD: Milus Banister , MD Date of Birth: [birth year] CSN: [id number] Age: 55 Admit Type: Outpatient Procedure:                Upper EUS Indications:              Incidental subepithelial lesion of the proximal                            stomach noted on recent EGDs Providers:                Milus Banister, MD, Joya Gaskins, Luan Moore, Technician, Margurite Auerbach, CRNA Referring MD:             Gerrit Heck, DO Medicines:                Monitored Anesthesia Care Complications:            No immediate complications. Estimated blood loss:                            None. Estimated Blood Loss:     Estimated blood loss: none. Procedure:                Pre-Anesthesia Assessment:                           - Prior to the procedure, a History and Physical                            was performed, and patient medications and                            allergies were reviewed. The patient's tolerance of                            previous anesthesia was also reviewed. The risks                            and benefits of the procedure and the sedation                            options and risks were discussed with the patient.                            All questions were answered, and informed consent                            was obtained. Prior Anticoagulants: The patient has                            taken no previous anticoagulant or antiplatelet  agents. ASA Grade Assessment: II - A patient with                            mild systemic disease. After reviewing the risks                            and benefits, the patient was deemed in                            satisfactory condition to undergo the procedure.                           After obtaining informed consent, the endoscope was                             passed under direct vision. Throughout the                            procedure, the patient's blood pressure, pulse, and                            oxygen saturations were monitored continuously. The                            [id number] ([id number]) Olympus radial ultrasound                            scope was introduced through the mouth, and                            advanced to the second part of duodenum. The upper                            EUS was accomplished without difficulty. The                            patient tolerated the procedure well. Scope In: Scope Out: Findings:      ENDOSCOPIC FINDING: :      1. The previously noted subepithelial lesion of the gastric fundus was       easily located, approximately 4cm distal to the GE junction. It was       small. There was evidence of previous mucosal biospies present.       Following EUS evaluation, this was sampled with tunnel biopsies and sent       to pathology.      ENDOSONOGRAPHIC FINDING: :      1. The lesion above correlated with a 7.75mm by 5.72mm oval shaped,       hypoechoic mass that clearly communicated with the muscularis propria       layer of the proximal gastric wall.      2. No perigastric adenopathy.      3. Limited views of the liver, pancreas, spleen, portal and splenic       vessels were all normal. Impression:               -  7.71mm by 5.76mm oval subepithelial lesion that                            communicates with the muscularis propria layer of                            the proximal gastric wall. 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 It will probably be reasonable                            to follow this with surveillance examination (EUS),                            await final pathology result from tunnel biopsy. Moderate Sedation:      Not Applicable - Patient had care per Anesthesia. Recommendation:           - Discharge patient to home (ambulatory). Procedure  Code(s):        --- Professional ---                           [phone], Esophagogastroduodenoscopy, flexible,                            transoral; with endoscopic ultrasound examination                            limited to the esophagus, stomach or duodenum, and                            adjacent structures                           [id number], Esophagogastroduodenoscopy, flexible,                            transoral; with biopsy, single or multiple Diagnosis Code(s):        --- Professional ---                           K29.70, Gastritis, unspecified, without bleeding                           K31.89, Other diseases of stomach and duodenum CPT copyright 2019 American Medical Association. All rights reserved. The codes documented in this report are preliminary and upon coder review may  be revised to meet current compliance requirements. Milus Banister, MD 03/13/2021 10:25:85 PM This report has been signed electronically. Number of Addenda: 0

## 2021-03-13 NOTE — Anesthesia Postprocedure Evaluation (Signed)
Anesthesia Post Note  Patient: Benjamin Chen  Procedure(s) Performed: UPPER ENDOSCOPIC ULTRASOUND (EUS) RADIAL WITH DIALATION BIOPSY ESOPHAGOGASTRODUODENOSCOPY (EGD)     Patient location during evaluation: Endoscopy Anesthesia Type: MAC Level of consciousness: awake and alert Pain management: pain level controlled Vital Signs Assessment: post-procedure vital signs reviewed and stable Respiratory status: spontaneous breathing, nonlabored ventilation and respiratory function stable Cardiovascular status: stable and blood pressure returned to baseline Postop Assessment: no apparent nausea or vomiting Anesthetic complications: no   No notable events documented.  Last Vitals:  Vitals:   03/13/21 1240 03/13/21 1250  BP: (!) 128/94 (!) 131/95  Pulse: 79 72  Resp: 12 17  Temp:    SpO2: 95% 99%    Last Pain:  Vitals:   03/13/21 1250  TempSrc:   PainSc: 0-No pain                 Terrall Bley,W. EDMOND

## 2021-03-13 NOTE — Anesthesia Preprocedure Evaluation (Addendum)
Anesthesia Evaluation  Patient identified by MRN, date of birth, ID band Patient awake    Reviewed: Allergy & Precautions, H&P , NPO status , Patient's Chart, lab work & pertinent test results  Airway Mallampati: II  TM Distance: >3 FB Neck ROM: Full    Dental no notable dental hx. (+) Teeth Intact, Dental Advisory Given   Pulmonary sleep apnea ,    Pulmonary exam normal breath sounds clear to auscultation       Cardiovascular hypertension, Pt. on medications  Rhythm:Regular Rate:Normal     Neuro/Psych negative neurological ROS  negative psych ROS   GI/Hepatic Neg liver ROS, GERD  Medicated,  Endo/Other  diabetes, Type 2, Oral Hypoglycemic AgentsMorbid obesity  Renal/GU negative Renal ROS  negative genitourinary   Musculoskeletal  (+) Arthritis , Osteoarthritis,    Abdominal   Peds  Hematology negative hematology ROS (+)   Anesthesia Other Findings   Reproductive/Obstetrics negative OB ROS                            Anesthesia Physical Anesthesia Plan  ASA: 3  Anesthesia Plan: MAC   Post-op Pain Management:    Induction: Intravenous  PONV Risk Score and Plan: 1 and Propofol infusion  Airway Management Planned: Simple Face Mask  Additional Equipment:   Intra-op Plan:   Post-operative Plan:   Informed Consent: I have reviewed the patients History and Physical, chart, labs and discussed the procedure including the risks, benefits and alternatives for the proposed anesthesia with the patient or authorized representative who has indicated his/her understanding and acceptance.     Dental advisory given  Plan Discussed with: CRNA  Anesthesia Plan Comments:         Anesthesia Quick Evaluation

## 2021-03-14 ENCOUNTER — Encounter (HOSPITAL_COMMUNITY): Payer: Self-pay | Admitting: Gastroenterology

## 2021-03-20 LAB — SURGICAL PATHOLOGY

## 2021-03-30 IMAGING — CR DG CHEST 2V
2 series · 2 of 2 positions shown · non-contrast
Comparison: 07/18/2019 chest radiograph.

CLINICAL DATA: Preop evaluation.

EXAM:
CHEST - 2 VIEW

[w chest pa]
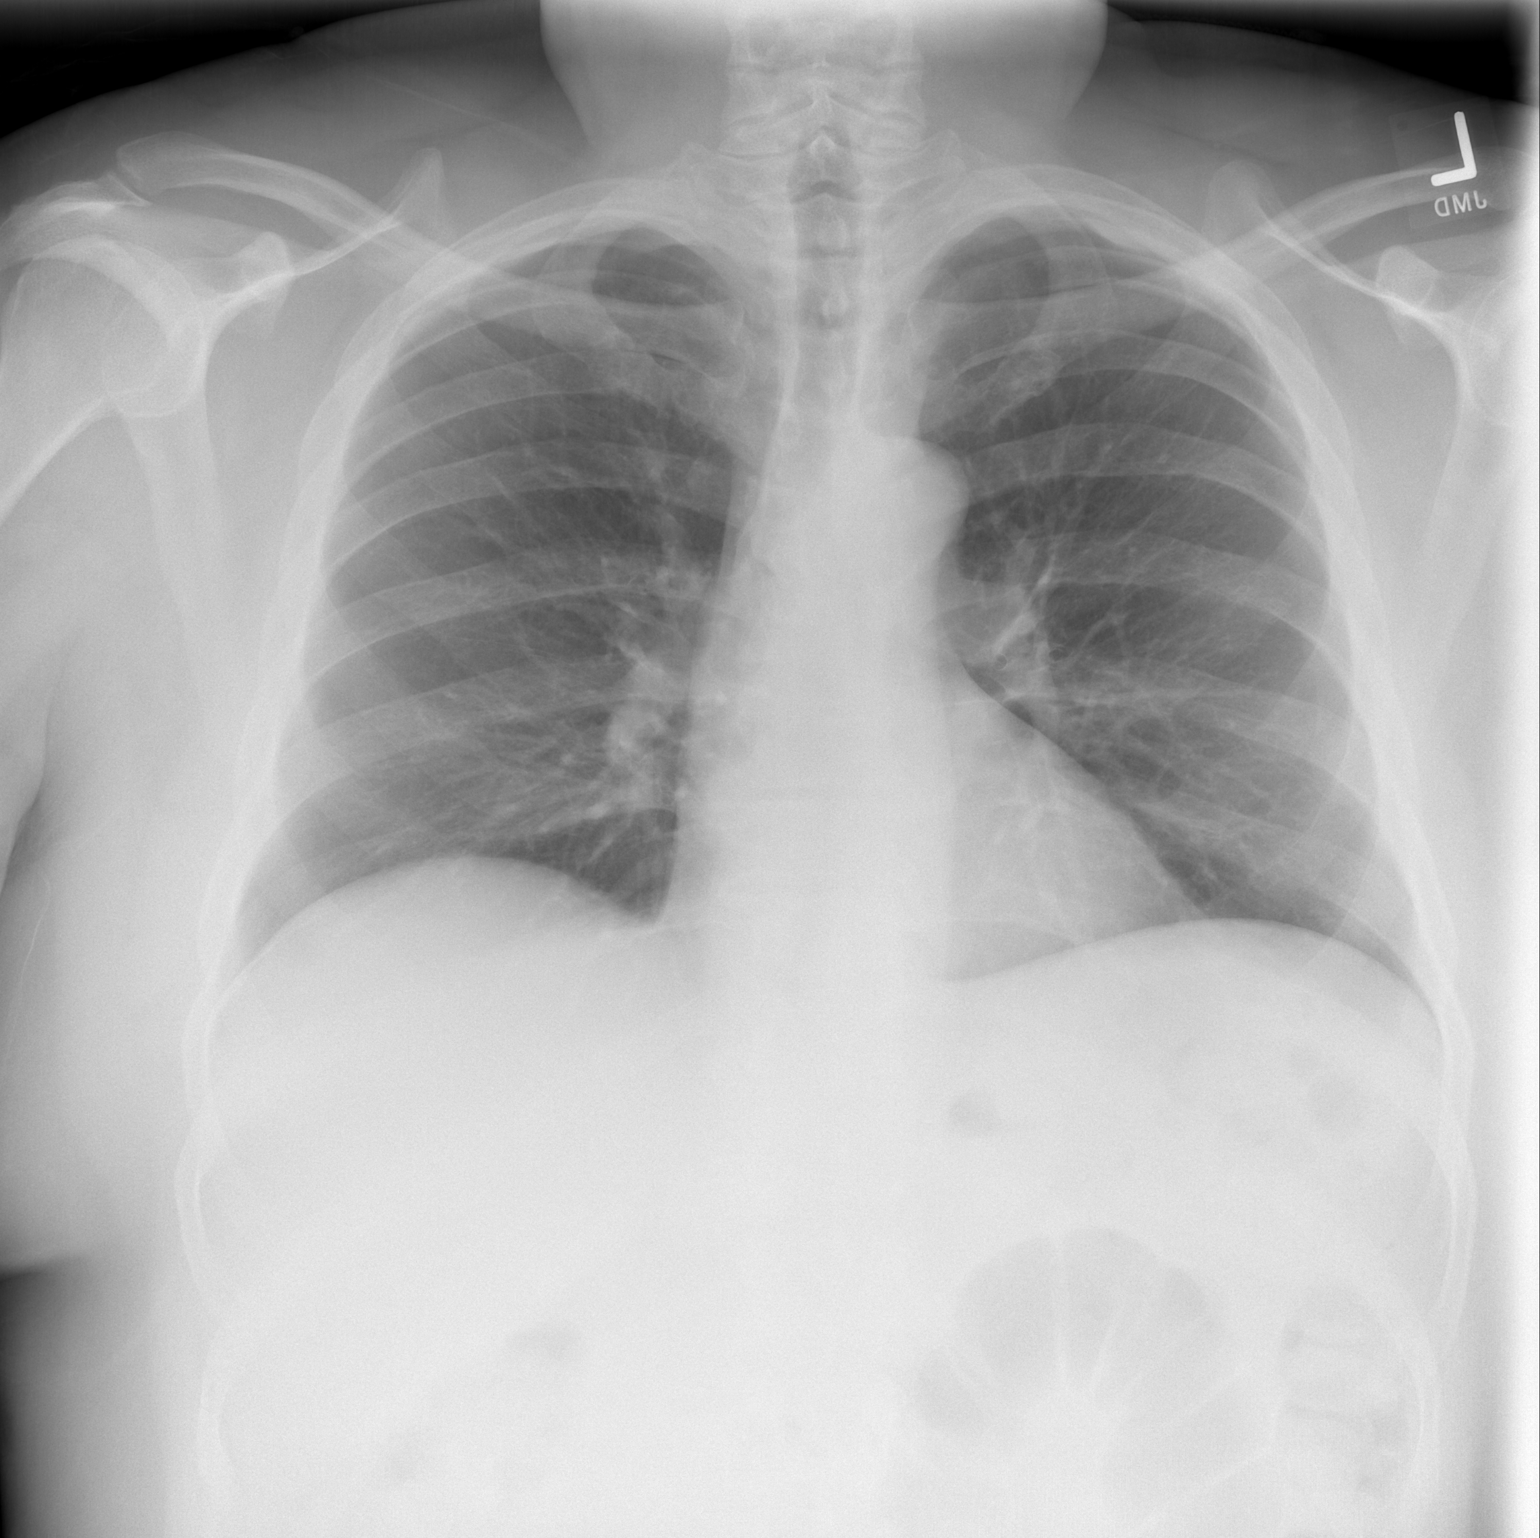

[w chest lat]
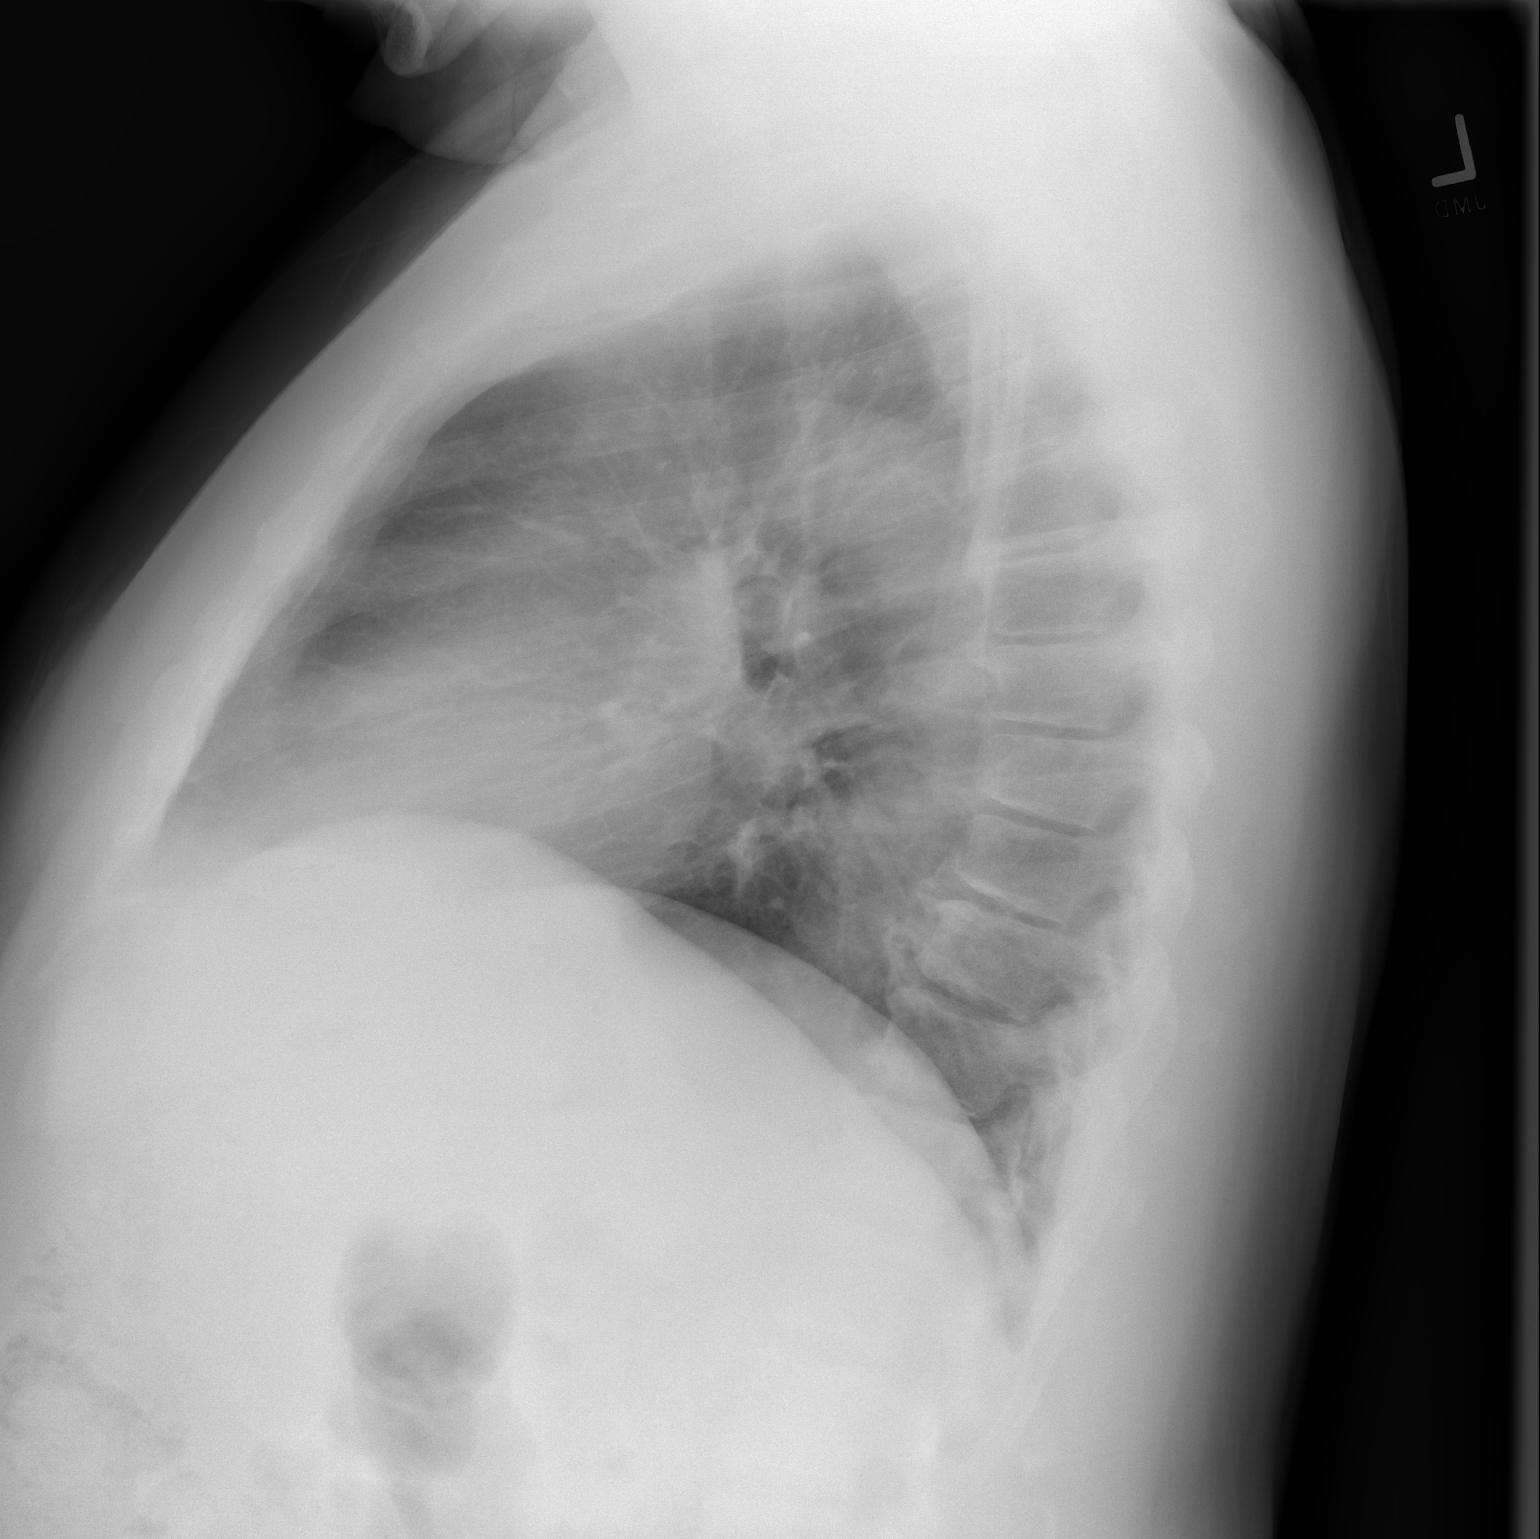

[2 of 2 positions shown; findings below may reference images not displayed]

FINDINGS: The heart size and mediastinal contours are within normal limits.
Both lungs are clear. The visualized skeletal structures are
unremarkable.
IMPRESSION: No focal airspace disease.

## 2021-04-19 ENCOUNTER — Other Ambulatory Visit: Payer: Self-pay | Admitting: Internal Medicine

## 2021-05-10 ENCOUNTER — Other Ambulatory Visit: Payer: Self-pay | Admitting: Internal Medicine

## 2021-06-15 ENCOUNTER — Encounter: Payer: Self-pay | Admitting: Internal Medicine

## 2021-06-15 MED ORDER — BENAZEPRIL HCL 20 MG PO TABS: 20.0000 mg | ORAL_TABLET | Freq: Every day | ORAL | 3 refills | Status: DC

## 2021-06-16 ENCOUNTER — Other Ambulatory Visit: Payer: Self-pay | Admitting: Internal Medicine

## 2021-06-16 NOTE — Telephone Encounter (Signed)
Please advice pharmacy is requesting alternative due to insurance

## 2021-06-17 NOTE — Telephone Encounter (Signed)
Please advise 

## 2021-06-17 NOTE — Telephone Encounter (Signed)
Message sent to patient to check with insurance company to see what was covered and let us know.

## 2021-07-02 ENCOUNTER — Other Ambulatory Visit: Payer: Self-pay | Admitting: Internal Medicine

## 2021-07-06 ENCOUNTER — Encounter: Payer: Self-pay | Admitting: Internal Medicine

## 2021-07-06 ENCOUNTER — Other Ambulatory Visit: Payer: Self-pay | Admitting: Internal Medicine

## 2021-07-07 ENCOUNTER — Encounter: Payer: Self-pay | Admitting: Internal Medicine

## 2021-07-07 NOTE — Patient Instructions (Addendum)
Blood work was ordered.     Medications changes include :   none   Your prescription(s) have been sent to your pharmacy.     Return in about 6 months (around 01/05/2022) for follow up.   Health Maintenance, Male Adopting a healthy lifestyle and getting preventive care are important in promoting health and wellness. Ask your health care provider about: The right schedule for you to have regular tests and exams. Things you can do on your own to prevent diseases and keep yourself healthy. What should I know about diet, weight, and exercise? Eat a healthy diet  Eat a diet that includes plenty of vegetables, fruits, low-fat dairy products, and lean protein. Do not eat a lot of foods that are high in solid fats, added sugars, or sodium. Maintain a healthy weight Body mass index (BMI) is a measurement that can be used to identify possible weight problems. It estimates body fat based on height and weight. Your health care provider can help determine your BMI and help you achieve or maintain a healthy weight. Get regular exercise Get regular exercise. This is one of the most important things you can do for your health. Most adults should: Exercise for at least 150 minutes each week. The exercise should increase your heart rate and make you sweat (moderate-intensity exercise). Do strengthening exercises at least twice a week. This is in addition to the moderate-intensity exercise. Spend less time sitting. Even light physical activity can be beneficial. Watch cholesterol and blood lipids Have your blood tested for lipids and cholesterol at 55 years of age, then have this test every 5 years. You may need to have your cholesterol levels checked more often if: Your lipid or cholesterol levels are high. You are older than 55 years of age. You are at high risk for heart disease. What should I know about cancer screening? Many types of cancers can be detected early and may often be  prevented. Depending on your health history and family history, you may need to have cancer screening at various ages. This may include screening for: Colorectal cancer. Prostate cancer. Skin cancer. Lung cancer. What should I know about heart disease, diabetes, and high blood pressure? Blood pressure and heart disease High blood pressure causes heart disease and increases the risk of stroke. This is more likely to develop in people who have high blood pressure readings or are overweight. Talk with your health care provider about your target blood pressure readings. Have your blood pressure checked: Every 3-5 years if you are 35-80 years of age. Every year if you are 35 years old or older. If you are between the ages of 76 and 34 and are a current or former smoker, ask your health care provider if you should have a one-time screening for abdominal aortic aneurysm (AAA). Diabetes Have regular diabetes screenings. This checks your fasting blood sugar level. Have the screening done: Once every three years after age 8 if you are at a normal weight and have a low risk for diabetes. More often and at a younger age if you are overweight or have a high risk for diabetes. What should I know about preventing infection? Hepatitis B If you have a higher risk for hepatitis B, you should be screened for this virus. Talk with your health care provider to find out if you are at risk for hepatitis B infection. Hepatitis C Blood testing is recommended for: Everyone born from [birth year] through 1964. Anyone with known  risk factors for hepatitis C. Sexually transmitted infections (STIs) You should be screened each year for STIs, including gonorrhea and chlamydia, if: You are sexually active and are younger than 54 years of age. You are older than 55 years of age and your health care provider tells you that you are at risk for this type of infection. Your sexual activity has changed since you were last screened,  and you are at increased risk for chlamydia or gonorrhea. Ask your health care provider if you are at risk. Ask your health care provider about whether you are at high risk for HIV. Your health care provider may recommend a prescription medicine to help prevent HIV infection. If you choose to take medicine to prevent HIV, you should first get tested for HIV. You should then be tested every 3 months for as long as you are taking the medicine. Follow these instructions at home: Alcohol use Do not drink alcohol if your health care provider tells you not to drink. If you drink alcohol: Limit how much you have to 0-2 drinks a day. Know how much alcohol is in your drink. In the [place] one drink equals one 12 oz bottle of beer (355 mL), one 5 oz glass of wine (148 mL), or one 1 oz glass of hard liquor (44 mL). Lifestyle Do not use any products that contain nicotine or tobacco. These products include cigarettes, chewing tobacco, and vaping devices, such as e-cigarettes. If you need help quitting, ask your health care provider. Do not use street drugs. Do not share needles. Ask your health care provider for help if you need support or information about quitting drugs. General instructions Schedule regular health, dental, and eye exams. Stay current with your vaccines. Tell your health care provider if: You often feel depressed. You have ever been abused or do not feel safe at home. Summary Adopting a healthy lifestyle and getting preventive care are important in promoting health and wellness. Follow your health care provider's instructions about healthy diet, exercising, and getting tested or screened for diseases. Follow your health care provider's instructions on monitoring your cholesterol and blood pressure. This information is not intended to replace advice given to you by your health care provider. Make sure you discuss any questions you have with your health care provider. Document Revised:  09/16/2020 Document Reviewed: 09/16/2020 Elsevier Patient Education  [place].

## 2021-07-07 NOTE — Progress Notes (Signed)
Subjective:    Patient ID: Benjamin Chen, male    DOB: Apr 18, 1966, 56 y.o.   MRN: [id number]   This visit occurred during the SARS-CoV-2 public health emergency.  Safety protocols were in place, including screening questions prior to the visit, additional usage of staff PPE, and extensive cleaning of exam room while observing appropriate contact time as indicated for disinfecting solutions.   HPI He is here for a physical exam.   His insurance will not cover his jardiance or tradjenta.  He is not compliant with a diabetic diet.  He has not been exercising.     Medications and allergies reviewed with patient and updated if appropriate.  Patient Active Problem List   Diagnosis Date Noted   Status post total knee replacement, right 03/01/2020   Osteoarthritis of knee 12/10/2019   Shortness of breath 07/03/2019   Palpitations 07/03/2019   GERD (gastroesophageal reflux disease) 07/03/2019   Low testosterone in male 01/18/2019   Fatty liver 03/27/2018   Cholelithiasis 03/27/2018   Fatigue 10/13/2017   Obesity, morbid ([place]) 01/12/2017   Chest pain 10/07/2016   Erectile dysfunction 10/07/2016   Gout 01/29/2016   Arthralgia 01/24/2016   Hyperlipidemia 01/24/2016   Essential hypertension, benign 12/30/2015   Diabetes ([place]) 12/30/2015   OSA (obstructive sleep apnea) 12/30/2015   Bilateral primary osteoarthritis of knee 12/30/2015   Joint pain 12/30/2015    Current Outpatient Medications on File Prior to Visit  Medication Sig Dispense Refill   amLODipine (NORVASC) 5 MG tablet TAKE 1 TABLET BY MOUTH EVERY DAY 30 tablet 8   benazepril (LOTENSIN) 20 MG tablet Take 1 tablet (20 mg total) by mouth daily. 90 tablet 3   JARDIANCE 10 MG TABS tablet TAKE 1 TABLET DAILY 30 tablet 8   metFORMIN (GLUCOPHAGE-XR) 750 MG 24 hr tablet TAKE 2 TABLETS (1,500 MG TOTAL) BY MOUTH DAILY WITH BREAKFAST. 60 tablet 5   pantoprazole (PROTONIX) 40 MG tablet Take 1 tablet (40 mg total) by mouth daily.  (Patient taking differently: Take 40 mg by mouth every other day. In the morning) 90 tablet 3   pravastatin (PRAVACHOL) 10 MG tablet TAKE 1 TABLET BY MOUTH EVERY DAY 90 tablet 1   spironolactone (ALDACTONE) 25 MG tablet TAKE 1 TABLET BY MOUTH EVERY DAY 90 tablet 1   No current facility-administered medications on file prior to visit.    Past Medical History:  Diagnosis Date   Allergy    seasonal   Arthritis    Diabetes mellitus without complication (HCC)    GERD (gastroesophageal reflux disease)    occasional heartburn/no meds   Gout    Hyperlipidemia    Hypertension    Sleep apnea    Do not use it.    Past Surgical History:  Procedure Laterality Date   BIOPSY  03/13/2021   Procedure: BIOPSY;  Surgeon: Milus Banister, MD;  Location: [facility];  Service: Endoscopy;;   ESOPHAGOGASTRODUODENOSCOPY N/A 03/13/2021   Procedure: ESOPHAGOGASTRODUODENOSCOPY (EGD);  Surgeon: Milus Banister, MD;  Location: [facility];  Service: Endoscopy;  Laterality: N/A;   EUS N/A 03/13/2021   Procedure: UPPER ENDOSCOPIC ULTRASOUND (EUS) RADIAL WITH DIALATION;  Surgeon: Milus Banister, MD;  Location: [facility];  Service: Endoscopy;  Laterality: N/A;   KNEE ARTHROSCOPY Right    right - torn meniscus   KNEE ARTHROSCOPY Left    left - torn meniscus   TOTAL KNEE ARTHROPLASTY Right 03/01/2020   Procedure: TOTAL KNEE ARTHROPLASTY;  Surgeon: Dorna Leitz, MD;  Location: [facility];  Service: Orthopedics;  Laterality: Right;   UPPER GASTROINTESTINAL ENDOSCOPY  10/18/2020    Social History   Socioeconomic History   Marital status: Married    Spouse name: Not on file   Number of children: Not on file   Years of education: Not on file   Highest education level: Not on file  Occupational History   Not on file  Tobacco Use   Smoking status: Never   Smokeless tobacco: Never  Vaping Use   Vaping Use: Never used  Substance and Sexual Activity   Alcohol use: Yes    Alcohol/week: 2.0 standard  drinks    Types: 2 Standard drinks or equivalent per week   Drug use: No   Sexual activity: Yes    Birth control/protection: None    Comment: Married  Other Topics Concern   Not on file  Social History Narrative   Not on file   Social Determinants of Health   Financial Resource Strain: Not on file  Food Insecurity: Not on file  Transportation Needs: Not on file  Physical Activity: Not on file  Stress: Not on file  Social Connections: Not on file    Family History  Problem Relation Age of Onset   Hyperlipidemia Mother    Hypertension Mother    Mental illness Father    Heart disease Maternal Aunt    Diabetes Maternal Grandmother    Heart disease Sister    Hypertension Sister    Thyroid disease Brother    Colon cancer Neg Hx    Esophageal cancer Neg Hx     Review of Systems  Constitutional:  Positive for fatigue. Negative for fever.  HENT:  Positive for sore throat (occ). Negative for trouble swallowing.   Eyes:  Positive for visual disturbance (occ blurry vision - will schedule appt).  Respiratory:  Positive for shortness of breath (sometimes). Negative for cough and wheezing.   Cardiovascular:  Negative for chest pain, palpitations and leg swelling.  Gastrointestinal:  Negative for abdominal pain, blood in stool, constipation, diarrhea and nausea.       Intermittent gerd  Genitourinary:  Negative for difficulty urinating and dysuria.  Musculoskeletal:  Positive for arthralgias. Negative for back pain.  Neurological:  Negative for light-headedness and headaches.  Psychiatric/Behavioral:  Positive for sleep disturbance (not sleeping great). Negative for dysphoric mood. The patient is not nervous/anxious.       Objective:   Vitals:   07/08/21 0905  BP: 120/82  Pulse: 94  Temp: 98.2 F (36.8 C)  SpO2: 97%   Filed Weights   07/08/21 0905  Weight: 281 lb (127.5 kg)   Body mass index is 36.57 kg/m.  BP Readings from Last 3 Encounters:  07/08/21 120/82   03/13/21 (!) 131/95  02/24/21 118/86    Wt Readings from Last 3 Encounters:  07/08/21 281 lb (127.5 kg)  03/13/21 265 lb (120.2 kg)  02/24/21 270 lb 6 oz (122.6 kg)     Physical Exam Constitutional: He appears well-developed and well-nourished. No distress.  HENT:  Head: Normocephalic and atraumatic.  Right Ear: External ear normal.  Left Ear: External ear normal.  Mouth/Throat: Oropharynx is clear and moist.  Normal ear canals and TM b/l  Eyes: Conjunctivae and EOM are normal.  Neck: Neck supple. No tracheal deviation present. No thyromegaly present.  No carotid bruit  Cardiovascular: Normal rate, regular rhythm, normal heart sounds and intact distal pulses.   No murmur heard. Pulmonary/Chest: Effort normal and breath  sounds normal. No respiratory distress. He has no wheezes. He has no rales.  Abdominal: Soft. He exhibits no distension. There is no tenderness.  Genitourinary: deferred  Musculoskeletal: He exhibits no edema.  Lymphadenopathy:   He has no cervical adenopathy.  Skin: Skin is warm and dry. He is not diaphoretic.  Psychiatric: He has a normal mood and affect. His behavior is normal.         Assessment & Plan:   Physical exam: Screening blood work  ordered Exercise   none - stressed regular exercise Weight  stressed weight loss Substance abuse   none   Reviewed recommended immunizations.   Will schedule an eye exam   Health Maintenance  Topic Date Due   FOOT EXAM  02/23/2017   COVID-19 Vaccine (3 - Booster for Pfizer series) 10/25/2019   OPHTHALMOLOGY EXAM  07/20/2020   HEMOGLOBIN A1C  07/08/2021   INFLUENZA VACCINE  08/08/2021 (Originally 12/09/2020)   COLONOSCOPY (Pts 45-40yrs Insurance coverage will need to be confirmed)  10/18/2025   TETANUS/TDAP  10/14/2027   Hepatitis C Screening  Completed   HIV Screening  Completed   Zoster Vaccines- Shingrix  Completed   HPV VACCINES  Aged Out     See Problem List for Assessment and Plan of  chronic medical problems.

## 2021-07-08 ENCOUNTER — Other Ambulatory Visit: Payer: Self-pay

## 2021-07-08 ENCOUNTER — Ambulatory Visit (INDEPENDENT_AMBULATORY_CARE_PROVIDER_SITE_OTHER): Payer: 59 | Admitting: Internal Medicine

## 2021-07-08 VITALS — BP 120/82 | HR 94 | Temp 98.2°F | Ht 73.5 in | Wt 281.0 lb

## 2021-07-08 DIAGNOSIS — I10 Essential (primary) hypertension: Secondary | ICD-10-CM | POA: Diagnosis not present

## 2021-07-08 DIAGNOSIS — K760 Fatty (change of) liver, not elsewhere classified: Secondary | ICD-10-CM | POA: Diagnosis not present

## 2021-07-08 DIAGNOSIS — Z125 Encounter for screening for malignant neoplasm of prostate: Secondary | ICD-10-CM

## 2021-07-08 DIAGNOSIS — K219 Gastro-esophageal reflux disease without esophagitis: Secondary | ICD-10-CM | POA: Diagnosis not present

## 2021-07-08 DIAGNOSIS — E1169 Type 2 diabetes mellitus with other specified complication: Secondary | ICD-10-CM | POA: Diagnosis not present

## 2021-07-08 DIAGNOSIS — E782 Mixed hyperlipidemia: Secondary | ICD-10-CM | POA: Diagnosis not present

## 2021-07-08 DIAGNOSIS — G4733 Obstructive sleep apnea (adult) (pediatric): Secondary | ICD-10-CM

## 2021-07-08 DIAGNOSIS — Z0000 Encounter for general adult medical examination without abnormal findings: Secondary | ICD-10-CM | POA: Diagnosis not present

## 2021-07-08 LAB — TSH: TSH: 1.85 u[IU]/mL (ref 0.35–5.50)

## 2021-07-08 LAB — COMPREHENSIVE METABOLIC PANEL
ALT: 26 U/L (ref 0–53)
AST: 18 U/L (ref 0–37)
Albumin: 4.3 g/dL (ref 3.5–5.2)
Alkaline Phosphatase: 87 U/L (ref 39–117)
BUN: 16 mg/dL (ref 6–23)
CO2: 28 mEq/L (ref 19–32)
Calcium: 9.9 mg/dL (ref 8.4–10.5)
Chloride: 100 mEq/L (ref 96–112)
Creatinine, Ser: 1.13 mg/dL (ref 0.40–1.50)
GFR: 73.83 mL/min (ref >60.00)
Glucose, Bld: 177 mg/dL — ABNORMAL HIGH (ref 70–99)
Potassium: 4.2 mEq/L (ref 3.5–5.1)
Sodium: 137 mEq/L (ref 135–145)
Total Bilirubin: 1.1 mg/dL (ref 0.2–1.2)
Total Protein: 6.7 g/dL (ref 6.0–8.3)

## 2021-07-08 LAB — PSA: PSA: 0.45 ng/mL (ref 0.10–4.00)

## 2021-07-08 LAB — MICROALBUMIN / CREATININE URINE RATIO
Creatinine,U: 230.6 mg/dL
Microalb Creat Ratio: 0.4 mg/g (ref 0.0–30.0)
Microalb, Ur: 1 mg/dL (ref 0.0–1.9)

## 2021-07-08 LAB — CBC WITH DIFFERENTIAL/PLATELET
Basophils Absolute: 0.1 10*3/uL (ref 0.0–0.1)
Basophils Relative: 0.7 % (ref 0.0–3.0)
Eosinophils Absolute: 0.1 10*3/uL (ref 0.0–0.7)
Eosinophils Relative: 0.8 % (ref 0.0–5.0)
HCT: 44.9 % (ref 39.0–52.0)
Hemoglobin: 14.9 g/dL (ref 13.0–17.0)
Lymphocytes Relative: 29.4 % (ref 12.0–46.0)
Lymphs Abs: 2.6 10*3/uL (ref 0.7–4.0)
MCHC: 33.1 g/dL (ref 30.0–36.0)
MCV: 78.7 fl (ref 78.0–100.0)
Monocytes Absolute: 0.7 10*3/uL (ref 0.1–1.0)
Monocytes Relative: 8.3 % (ref 3.0–12.0)
Neutro Abs: 5.4 10*3/uL (ref 1.4–7.7)
Neutrophils Relative %: 60.8 % (ref 43.0–77.0)
Platelets: 323 10*3/uL (ref 150.0–400.0)
RBC: 5.71 Mil/uL (ref 4.22–5.81)
RDW: 15.7 % — ABNORMAL HIGH (ref 11.5–15.5)
WBC: 8.8 10*3/uL (ref 4.0–10.5)

## 2021-07-08 LAB — LIPID PANEL
Cholesterol: 156 mg/dL (ref 0–200)
HDL: 32.4 mg/dL — ABNORMAL LOW (ref >39.00)
NonHDL: 124.08
Total CHOL/HDL Ratio: 5
Triglycerides: 201 mg/dL — ABNORMAL HIGH (ref 0.0–149.0)
VLDL: 40.2 mg/dL — ABNORMAL HIGH (ref 0.0–40.0)

## 2021-07-08 LAB — HEMOGLOBIN A1C: Hgb A1c MFr Bld: 7.3 % — ABNORMAL HIGH (ref 4.6–6.5)

## 2021-07-08 LAB — LDL CHOLESTEROL, DIRECT: Direct LDL: 100 mg/dL

## 2021-07-08 MED ORDER — EMPAGLIFLOZIN 10 MG PO TABS: 10.0000 mg | ORAL_TABLET | Freq: Every day | ORAL | 1 refills | Status: DC

## 2021-07-08 NOTE — Assessment & Plan Note (Addendum)
Chronic Lab Results  Component Value Date   HGBA1C 7.1 (H) 01/06/2021   Not ideally controlled Check A1c, urine microalbumin today Insurance will not cover Jardiance or Tradjenta Continue metformin XR 1500 mg daily with breakfast Discussed Trulicity, Ozempic and Rybelsus -he would prefer not to take these Will try jardiance with savings card - go back on 10 mg daily - will increase if needed  Stressed regular exercise, diabetic diet and weight loss

## 2021-07-08 NOTE — Assessment & Plan Note (Signed)
Chronic Blood pressure well controlled CMP Continue amlodipine 5 mg daily, benazepril 20 mg daily, spironolactone 25 mg daily 

## 2021-07-08 NOTE — Assessment & Plan Note (Signed)
Chronic Regular exercise and healthy diet encouraged Check lipid panel  Continue pravastatin 10 mg daily 

## 2021-07-08 NOTE — Assessment & Plan Note (Addendum)
Chronic Stressed the importance of weight loss Encouraged regular exercise Encourage decrease portions, diabetic diet Discussed Trulicity, Ozempic and Rybelsus, which may help with sugar control and aid in weight loss - he declined

## 2021-07-08 NOTE — Assessment & Plan Note (Addendum)
Chronic EGD 8/22 with gastritis and H. pylori, status post dilation GERD controlled Continue pantoprazole 40 mg daily as needed - this is not covered - he will call to see what meds are covered

## 2021-07-08 NOTE — Assessment & Plan Note (Signed)
Chronic  Has not been using CPAP Does have some symptoms of sleep apnea and the other night had a choking episode at night Advised restarting CPAP and see if that helps improve his sleep and his energy level/motivation-he will try this

## 2021-07-08 NOTE — Assessment & Plan Note (Signed)
Chronic Stressed regular exercise, healthy diet and weight loss Advised to keep alcohol to a minimum

## 2021-08-14 ENCOUNTER — Other Ambulatory Visit: Payer: Self-pay | Admitting: Internal Medicine

## 2021-09-29 ENCOUNTER — Other Ambulatory Visit: Payer: Self-pay | Admitting: Internal Medicine

## 2022-01-06 NOTE — Patient Instructions (Addendum)
     Blood work was ordered.     Medications changes include :   pravastatin 20 mg daily   Your prescription(s) have been sent to your pharmacy.      Return in about 6 months (around 07/09/2022) for Physical Exam.

## 2022-01-06 NOTE — Progress Notes (Unsigned)
Subjective:    Patient ID: Benjamin Chen, male    DOB: 08-19-1965, 56 y.o.   MRN: [id number]     HPI Benjamin Chen is here for follow up of his chronic medical problems, including dm, htn, hld   He is not exercising.  Has not been compliant with diabetic diet - drinking coke zero.  He thinks his sugar will be a little higher.   Right knee a little sore.  More flexible.    Left knee starting to give him more trouble.    Occ electric shock in left chest and left finger tips.  Lasts one sec  Pain and swelling in left index MCP joint.     Medications and allergies reviewed with patient and updated if appropriate.  Current Outpatient Medications on File Prior to Visit  Medication Sig Dispense Refill   amLODipine (NORVASC) 5 MG tablet TAKE 1 TABLET BY MOUTH EVERY DAY 90 tablet 2   benazepril (LOTENSIN) 20 MG tablet Take 1 tablet (20 mg total) by mouth daily. 90 tablet 3   empagliflozin (JARDIANCE) 10 MG TABS tablet Take 1 tablet (10 mg total) by mouth daily. 90 tablet 1   pravastatin (PRAVACHOL) 10 MG tablet TAKE 1 TABLET BY MOUTH EVERY DAY 90 tablet 1   spironolactone (ALDACTONE) 25 MG tablet TAKE 1 TABLET BY MOUTH EVERY DAY 90 tablet 2   pantoprazole (PROTONIX) 40 MG tablet Take 1 tablet (40 mg total) by mouth daily. (Patient taking differently: Take 40 mg by mouth every other day. In the morning) 90 tablet 3   No current facility-administered medications on file prior to visit.     Review of Systems  Constitutional:  Negative for fever.  Respiratory:  Negative for cough, shortness of breath and wheezing.   Cardiovascular:  Negative for chest pain, palpitations and leg swelling.  Gastrointestinal:        Gerd controlled, occ gerd  Neurological:  Positive for light-headedness (occ). Negative for headaches.       Objective:   Vitals:   01/07/22 0836  BP: 120/82  Pulse: 86  Temp: 97.8 F (36.6 C)  SpO2: 97%   BP Readings from Last 3 Encounters:  01/07/22 120/82   07/08/21 120/82  03/13/21 (!) 131/95   Wt Readings from Last 3 Encounters:  01/07/22 269 lb (122 kg)  07/08/21 281 lb (127.5 kg)  03/13/21 265 lb (120.2 kg)   Body mass index is 35.01 kg/m.    Physical Exam Constitutional:      General: He is not in acute distress.    Appearance: Normal appearance. He is not ill-appearing.  HENT:     Head: Normocephalic and atraumatic.  Eyes:     Conjunctiva/sclera: Conjunctivae normal.  Cardiovascular:     Rate and Rhythm: Normal rate and regular rhythm.     Heart sounds: Normal heart sounds. No murmur heard. Pulmonary:     Effort: Pulmonary effort is normal. No respiratory distress.     Breath sounds: Normal breath sounds. No wheezing or rales.  Musculoskeletal:     Right lower leg: No edema.     Left lower leg: No edema.  Skin:    General: Skin is warm and dry.     Findings: No rash.  Neurological:     Mental Status: He is alert. Mental status is at baseline.  Psychiatric:        Mood and Affect: Mood normal.        Lab Results  Component  Value Date   WBC 8.8 07/08/2021   HGB 14.9 07/08/2021   HCT 44.9 07/08/2021   PLT 323.0 07/08/2021   GLUCOSE 177 (H) 07/08/2021   CHOL 156 07/08/2021   TRIG 201.0 (H) 07/08/2021   HDL 32.40 (L) 07/08/2021   LDLDIRECT 100.0 07/08/2021   LDLCALC 84 07/08/2020   ALT 26 07/08/2021   AST 18 07/08/2021   NA 137 07/08/2021   K 4.2 07/08/2021   CL 100 07/08/2021   CREATININE 1.13 07/08/2021   BUN 16 07/08/2021   CO2 28 07/08/2021   TSH 1.85 07/08/2021   PSA 0.45 07/08/2021   INR 0.9 02/19/2020   HGBA1C 7.3 (H) 07/08/2021   MICROALBUR 1.0 07/08/2021     Assessment & Plan:    See Problem List for Assessment and Plan of chronic medical problems.

## 2022-01-07 ENCOUNTER — Other Ambulatory Visit: Payer: Self-pay | Admitting: Internal Medicine

## 2022-01-07 ENCOUNTER — Ambulatory Visit: Payer: 59 | Admitting: Internal Medicine

## 2022-01-07 ENCOUNTER — Encounter: Payer: Self-pay | Admitting: Internal Medicine

## 2022-01-07 VITALS — BP 120/82 | HR 86 | Temp 97.8°F | Wt 269.0 lb

## 2022-01-07 DIAGNOSIS — E782 Mixed hyperlipidemia: Secondary | ICD-10-CM

## 2022-01-07 DIAGNOSIS — K2970 Gastritis, unspecified, without bleeding: Secondary | ICD-10-CM | POA: Diagnosis not present

## 2022-01-07 DIAGNOSIS — K2990 Gastroduodenitis, unspecified, without bleeding: Secondary | ICD-10-CM

## 2022-01-07 DIAGNOSIS — I10 Essential (primary) hypertension: Secondary | ICD-10-CM

## 2022-01-07 DIAGNOSIS — E1169 Type 2 diabetes mellitus with other specified complication: Secondary | ICD-10-CM

## 2022-01-07 LAB — LIPID PANEL
Cholesterol: 167 mg/dL (ref 0–200)
HDL: 33.1 mg/dL — ABNORMAL LOW (ref >39.00)
LDL Cholesterol: 105 mg/dL — ABNORMAL HIGH (ref 0–99)
NonHDL: 133.55
Total CHOL/HDL Ratio: 5
Triglycerides: 142 mg/dL (ref 0.0–149.0)
VLDL: 28.4 mg/dL (ref 0.0–40.0)

## 2022-01-07 LAB — COMPREHENSIVE METABOLIC PANEL
ALT: 21 U/L (ref 0–53)
AST: 17 U/L (ref 0–37)
Albumin: 4.3 g/dL (ref 3.5–5.2)
Alkaline Phosphatase: 83 U/L (ref 39–117)
BUN: 20 mg/dL (ref 6–23)
CO2: 27 mEq/L (ref 19–32)
Calcium: 9.8 mg/dL (ref 8.4–10.5)
Chloride: 101 mEq/L (ref 96–112)
Creatinine, Ser: 1.16 mg/dL (ref 0.40–1.50)
GFR: 71.3 mL/min (ref >60.00)
Glucose, Bld: 115 mg/dL — ABNORMAL HIGH (ref 70–99)
Potassium: 4.3 mEq/L (ref 3.5–5.1)
Sodium: 136 mEq/L (ref 135–145)
Total Bilirubin: 1.1 mg/dL (ref 0.2–1.2)
Total Protein: 7.3 g/dL (ref 6.0–8.3)

## 2022-01-07 LAB — HEMOGLOBIN A1C: Hgb A1c MFr Bld: 7.7 % — ABNORMAL HIGH (ref 4.6–6.5)

## 2022-01-07 MED ORDER — METFORMIN HCL ER 750 MG PO TB24: ORAL_TABLET | ORAL | 2 refills | Status: DC

## 2022-01-07 MED ORDER — PRAVASTATIN SODIUM 20 MG PO TABS: 20.0000 mg | ORAL_TABLET | Freq: Every day | ORAL | 3 refills | Status: DC

## 2022-01-07 MED ORDER — EMPAGLIFLOZIN 25 MG PO TABS: 25.0000 mg | ORAL_TABLET | Freq: Every day | ORAL | 2 refills | Status: DC

## 2022-01-07 MED ORDER — PANTOPRAZOLE SODIUM 40 MG PO TBEC: 40.0000 mg | DELAYED_RELEASE_TABLET | ORAL | Status: DC

## 2022-01-07 NOTE — Addendum Note (Signed)
Addended by: Binnie Rail on: 01/07/2022 12:15 PM   Modules accepted: Orders

## 2022-01-07 NOTE — Assessment & Plan Note (Addendum)
But increase to 20 mg daily since this is such a small dose and ideally would like to see his LDL little bit lowerChronic Regular exercise and healthy diet encouraged Check lipid panel  Continue pravastatin 10 mg daily

## 2022-01-07 NOTE — Assessment & Plan Note (Signed)
Chronic Blood pressure well controlled CMP Continue amlodipine 5 mg daily, benazepril 20 mg daily, spironolactone 25 mg daily

## 2022-01-07 NOTE — Assessment & Plan Note (Addendum)
Chronic  Lab Results  Component Value Date   HGBA1C 7.3 (H) 07/08/2021   Sugars not ideally controlled Check A1c Continue metformin xr 1500 mg daily, jardiance 10 mg dialy Most likely we will need to increase the Jardiance to 25 mg daily, but will wait to see what his A1c is Stressed the importance of exercising regularly, weight loss and better compliance with a diabetic diet

## 2022-06-21 ENCOUNTER — Other Ambulatory Visit: Payer: Self-pay | Admitting: Internal Medicine

## 2022-07-09 ENCOUNTER — Encounter: Payer: 59 | Admitting: Internal Medicine

## 2022-07-30 ENCOUNTER — Other Ambulatory Visit: Payer: Self-pay

## 2022-07-30 ENCOUNTER — Encounter: Payer: Self-pay | Admitting: Internal Medicine

## 2022-07-30 MED ORDER — BENAZEPRIL HCL 20 MG PO TABS: 20.0000 mg | ORAL_TABLET | Freq: Every day | ORAL | 3 refills | Status: DC

## 2022-08-04 ENCOUNTER — Encounter: Payer: Self-pay | Admitting: Internal Medicine

## 2022-08-04 NOTE — Patient Instructions (Addendum)
Blood work was ordered.   The lab is on the first floor.    Medications changes include :       A referral was ordered for XXX.     Someone will call you to schedule an appointment.    Return in about 6 months (around 02/05/2023) for follow up.    Health Maintenance, Male Adopting a healthy lifestyle and getting preventive care are important in promoting health and wellness. Ask your health care provider about: The right schedule for you to have regular tests and exams. Things you can do on your own to prevent diseases and keep yourself healthy. What should I know about diet, weight, and exercise? Eat a healthy diet  Eat a diet that includes plenty of vegetables, fruits, low-fat dairy products, and lean protein. Do not eat a lot of foods that are high in solid fats, added sugars, or sodium. Maintain a healthy weight Body mass index (BMI) is a measurement that can be used to identify possible weight problems. It estimates body fat based on height and weight. Your health care provider can help determine your BMI and help you achieve or maintain a healthy weight. Get regular exercise Get regular exercise. This is one of the most important things you can do for your health. Most adults should: Exercise for at least 150 minutes each week. The exercise should increase your heart rate and make you sweat (moderate-intensity exercise). Do strengthening exercises at least twice a week. This is in addition to the moderate-intensity exercise. Spend less time sitting. Even light physical activity can be beneficial. Watch cholesterol and blood lipids Have your blood tested for lipids and cholesterol at 56 years of age, then have this test every 5 years. You may need to have your cholesterol levels checked more often if: Your lipid or cholesterol levels are high. You are older than 56 years of age. You are at high risk for heart disease. What should I know about cancer screening? Many  types of cancers can be detected early and may often be prevented. Depending on your health history and family history, you may need to have cancer screening at various ages. This may include screening for: Colorectal cancer. Prostate cancer. Skin cancer. Lung cancer. What should I know about heart disease, diabetes, and high blood pressure? Blood pressure and heart disease High blood pressure causes heart disease and increases the risk of stroke. This is more likely to develop in people who have high blood pressure readings or are overweight. Talk with your health care provider about your target blood pressure readings. Have your blood pressure checked: Every 3-5 years if you are 40-14 years of age. Every year if you are 13 years old or older. If you are between the ages of 107 and 30 and are a current or former smoker, ask your health care provider if you should have a one-time screening for abdominal aortic aneurysm (AAA). Diabetes Have regular diabetes screenings. This checks your fasting blood sugar level. Have the screening done: Once every three years after age 12 if you are at a normal weight and have a low risk for diabetes. More often and at a younger age if you are overweight or have a high risk for diabetes. What should I know about preventing infection? Hepatitis B If you have a higher risk for hepatitis B, you should be screened for this virus. Talk with your health care provider to find out if you are at risk  for hepatitis B infection. Hepatitis C Blood testing is recommended for: Everyone born from [birth year] through 1965. Anyone with known risk factors for hepatitis C. Sexually transmitted infections (STIs) You should be screened each year for STIs, including gonorrhea and chlamydia, if: You are sexually active and are younger than 56 years of age. You are older than 56 years of age and your health care provider tells you that you are at risk for this type of infection. Your  sexual activity has changed since you were last screened, and you are at increased risk for chlamydia or gonorrhea. Ask your health care provider if you are at risk. Ask your health care provider about whether you are at high risk for HIV. Your health care provider may recommend a prescription medicine to help prevent HIV infection. If you choose to take medicine to prevent HIV, you should first get tested for HIV. You should then be tested every 3 months for as long as you are taking the medicine. Follow these instructions at home: Alcohol use Do not drink alcohol if your health care provider tells you not to drink. If you drink alcohol: Limit how much you have to 0-2 drinks a day. Know how much alcohol is in your drink. In the [place] one drink equals one 12 oz bottle of beer (355 mL), one 5 oz glass of wine (148 mL), or one 1 oz glass of hard liquor (44 mL). Lifestyle Do not use any products that contain nicotine or tobacco. These products include cigarettes, chewing tobacco, and vaping devices, such as e-cigarettes. If you need help quitting, ask your health care provider. Do not use street drugs. Do not share needles. Ask your health care provider for help if you need support or information about quitting drugs. General instructions Schedule regular health, dental, and eye exams. Stay current with your vaccines. Tell your health care provider if: You often feel depressed. You have ever been abused or do not feel safe at home. Summary Adopting a healthy lifestyle and getting preventive care are important in promoting health and wellness. Follow your health care provider's instructions about healthy diet, exercising, and getting tested or screened for diseases. Follow your health care provider's instructions on monitoring your cholesterol and blood pressure. This information is not intended to replace advice given to you by your health care provider. Make sure you discuss any questions you  have with your health care provider. Document Revised: 09/16/2020 Document Reviewed: 09/16/2020 Elsevier Patient Education  [place].

## 2022-08-04 NOTE — Progress Notes (Signed)
Subjective:    Patient ID: Benjamin Chen, male    DOB: Oct 17, 1965, 57 y.o.   MRN: [id number]     HPI [name] is here for a physical exam and his chronic medical problems.   B/l knee pain.  S/p R TKR.  L knee hurts.    Occ feels something in his chest and then feels his tingling in left hand.  Occurs on occasion-no pattern to when this happens   Medications and allergies reviewed with patient and updated if appropriate.  Current Outpatient Medications on File Prior to Visit  Medication Sig Dispense Refill   amLODipine (NORVASC) 5 MG tablet TAKE 1 TABLET BY MOUTH EVERY DAY 90 tablet 2   benazepril (LOTENSIN) 20 MG tablet Take 1 tablet (20 mg total) by mouth daily. 90 tablet 3   empagliflozin (JARDIANCE) 25 MG TABS tablet Take 1 tablet (25 mg total) by mouth daily before breakfast. 90 tablet 2   metFORMIN (GLUCOPHAGE-XR) 750 MG 24 hr tablet TAKE 2 TABLETS (1,500 MG TOTAL) BY MOUTH EVERY DAY WITH BREAKFAST 180 tablet 2   pantoprazole (PROTONIX) 40 MG tablet Take 1 tablet (40 mg total) by mouth every other day. In the morning     pravastatin (PRAVACHOL) 20 MG tablet Take 1 tablet (20 mg total) by mouth daily. 90 tablet 3   spironolactone (ALDACTONE) 25 MG tablet TAKE 1 TABLET BY MOUTH EVERY DAY 90 tablet 1   No current facility-administered medications on file prior to visit.    Review of Systems  Constitutional:  Negative for fever.  Eyes:  Negative for visual disturbance.  Respiratory:  Negative for cough, shortness of breath and wheezing.   Cardiovascular:  Negative for chest pain, palpitations and leg swelling.  Gastrointestinal:  Negative for abdominal pain, blood in stool, constipation and diarrhea.       No gerd  Genitourinary:  Negative for difficulty urinating and dysuria.  Musculoskeletal:  Positive for arthralgias (b/l knees, left shoulder). Negative for back pain.  Skin:  Negative for rash.  Neurological:  Positive for light-headedness (when bending down and  getting back up) and numbness (in fingers when sleeping). Negative for headaches.  Psychiatric/Behavioral:  Negative for dysphoric mood. The patient is not nervous/anxious.        Anger issues       Objective:   Vitals:   08/05/22 1032  BP: 122/74  Pulse: 80  Temp: 98.6 F (37 C)  SpO2: 97%   Filed Weights   08/05/22 1032  Weight: 258 lb (117 kg)   Body mass index is 33.58 kg/m.  BP Readings from Last 3 Encounters:  08/05/22 122/74  01/07/22 120/82  07/08/21 120/82    Wt Readings from Last 3 Encounters:  08/05/22 258 lb (117 kg)  01/07/22 269 lb (122 kg)  07/08/21 281 lb (127.5 kg)      Physical Exam Constitutional: He appears well-developed and well-nourished. No distress.  HENT:  Head: Normocephalic and atraumatic.  Right Ear: External ear normal.  Left Ear: External ear normal.  Normal ear canals and TM b/l  Mouth/Throat: Oropharynx is clear and moist. Eyes: Conjunctivae and EOM are normal.  Neck: Neck supple. No tracheal deviation present. No thyromegaly present.  No carotid bruit  Cardiovascular: Normal rate, regular rhythm, normal heart sounds and intact distal pulses.   No murmur heard.  No lower extremity edema. Pulmonary/Chest: Effort normal and breath sounds normal. No respiratory distress. He has no wheezes. He has no rales.  Abdominal: Soft. He  exhibits no distension. There is no tenderness.  Genitourinary: deferred  Lymphadenopathy:   He has no cervical adenopathy.  Skin: Skin is warm and dry. He is not diaphoretic.  Psychiatric: He has a normal mood and affect. His behavior is normal.         Assessment & Plan:   Physical exam: Screening blood work  ordered Exercise   none - not motivated Weight  obese - stressed weight loss Substance abuse   none  Eye exam up to date   Reviewed recommended immunizations.   Health Maintenance  Topic Date Due   FOOT EXAM  02/23/2017   OPHTHALMOLOGY EXAM  07/20/2020   Diabetic kidney evaluation  - Urine ACR  07/08/2022   HEMOGLOBIN A1C  07/09/2022   INFLUENZA VACCINE  08/09/2022 (Originally 12/09/2021)   COVID-19 Vaccine (3 - 2023-24 season) 08/21/2022 (Originally 01/09/2022)   Diabetic kidney evaluation - eGFR measurement  01/08/2023   COLONOSCOPY (Pts 45-57yrs Insurance coverage will need to be confirmed)  10/18/2025   DTaP/Tdap/Td (2 - Td or Tdap) 10/14/2027   Hepatitis C Screening  Completed   HIV Screening  Completed   Zoster Vaccines- Shingrix  Completed   HPV VACCINES  Aged Out    He is concerned about his anger issues and feels he needs to talk to someone.  He would like to see a therapist closer to where he lives.  He will look into establishing with someone if he has any difficulty he will let me know.  See Problem List for Assessment and Plan of chronic medical problems.

## 2022-08-05 ENCOUNTER — Ambulatory Visit (INDEPENDENT_AMBULATORY_CARE_PROVIDER_SITE_OTHER): Payer: 59

## 2022-08-05 ENCOUNTER — Ambulatory Visit (INDEPENDENT_AMBULATORY_CARE_PROVIDER_SITE_OTHER): Payer: 59 | Admitting: Internal Medicine

## 2022-08-05 VITALS — BP 122/74 | HR 80 | Temp 98.6°F | Ht 73.5 in | Wt 258.0 lb

## 2022-08-05 DIAGNOSIS — Z0000 Encounter for general adult medical examination without abnormal findings: Secondary | ICD-10-CM | POA: Diagnosis not present

## 2022-08-05 DIAGNOSIS — E782 Mixed hyperlipidemia: Secondary | ICD-10-CM | POA: Diagnosis not present

## 2022-08-05 DIAGNOSIS — E1169 Type 2 diabetes mellitus with other specified complication: Secondary | ICD-10-CM | POA: Diagnosis not present

## 2022-08-05 DIAGNOSIS — I10 Essential (primary) hypertension: Secondary | ICD-10-CM | POA: Diagnosis not present

## 2022-08-05 DIAGNOSIS — Z125 Encounter for screening for malignant neoplasm of prostate: Secondary | ICD-10-CM

## 2022-08-05 DIAGNOSIS — M25512 Pain in left shoulder: Secondary | ICD-10-CM

## 2022-08-05 DIAGNOSIS — Z136 Encounter for screening for cardiovascular disorders: Secondary | ICD-10-CM

## 2022-08-05 DIAGNOSIS — K219 Gastro-esophageal reflux disease without esophagitis: Secondary | ICD-10-CM | POA: Diagnosis not present

## 2022-08-05 DIAGNOSIS — M1712 Unilateral primary osteoarthritis, left knee: Secondary | ICD-10-CM

## 2022-08-05 LAB — CBC WITH DIFFERENTIAL/PLATELET
Basophils Absolute: 0.1 10*3/uL (ref 0.0–0.1)
Basophils Relative: 0.7 % (ref 0.0–3.0)
Eosinophils Absolute: 0.1 10*3/uL (ref 0.0–0.7)
Eosinophils Relative: 0.9 % (ref 0.0–5.0)
HCT: 50.2 % (ref 39.0–52.0)
Hemoglobin: 17.1 g/dL — ABNORMAL HIGH (ref 13.0–17.0)
Lymphocytes Relative: 29.9 % (ref 12.0–46.0)
Lymphs Abs: 2.3 10*3/uL (ref 0.7–4.0)
MCHC: 34 g/dL (ref 30.0–36.0)
MCV: 78.6 fl (ref 78.0–100.0)
Monocytes Absolute: 0.6 10*3/uL (ref 0.1–1.0)
Monocytes Relative: 8 % (ref 3.0–12.0)
Neutro Abs: 4.7 10*3/uL (ref 1.4–7.7)
Neutrophils Relative %: 60.5 % (ref 43.0–77.0)
Platelets: 349 10*3/uL (ref 150.0–400.0)
RBC: 6.39 Mil/uL — ABNORMAL HIGH (ref 4.22–5.81)
RDW: 14.7 % (ref 11.5–15.5)
WBC: 7.8 10*3/uL (ref 4.0–10.5)

## 2022-08-05 LAB — MICROALBUMIN / CREATININE URINE RATIO
Creatinine,U: 120 mg/dL
Microalb Creat Ratio: 0.6 mg/g (ref 0.0–30.0)
Microalb, Ur: <0.7 mg/dL (ref 0.0–1.9)

## 2022-08-05 LAB — COMPREHENSIVE METABOLIC PANEL
ALT: 14 U/L (ref 0–53)
AST: 12 U/L (ref 0–37)
Albumin: 4.5 g/dL (ref 3.5–5.2)
Alkaline Phosphatase: 94 U/L (ref 39–117)
BUN: 12 mg/dL (ref 6–23)
CO2: 30 mEq/L (ref 19–32)
Calcium: 10.5 mg/dL (ref 8.4–10.5)
Chloride: 100 mEq/L (ref 96–112)
Creatinine, Ser: 1.25 mg/dL (ref 0.40–1.50)
GFR: 64.92 mL/min (ref >60.00)
Glucose, Bld: 110 mg/dL — ABNORMAL HIGH (ref 70–99)
Potassium: 4.7 mEq/L (ref 3.5–5.1)
Sodium: 138 mEq/L (ref 135–145)
Total Bilirubin: 1.3 mg/dL — ABNORMAL HIGH (ref 0.2–1.2)
Total Protein: 7 g/dL (ref 6.0–8.3)

## 2022-08-05 LAB — LIPID PANEL
Cholesterol: 170 mg/dL (ref 0–200)
HDL: 33.4 mg/dL — ABNORMAL LOW (ref >39.00)
LDL Cholesterol: 99 mg/dL (ref 0–99)
NonHDL: 136.86
Total CHOL/HDL Ratio: 5
Triglycerides: 190 mg/dL — ABNORMAL HIGH (ref 0.0–149.0)
VLDL: 38 mg/dL (ref 0.0–40.0)

## 2022-08-05 LAB — HEMOGLOBIN A1C: Hgb A1c MFr Bld: 7.4 % — ABNORMAL HIGH (ref 4.6–6.5)

## 2022-08-05 LAB — PSA: PSA: 0.54 ng/mL (ref 0.10–4.00)

## 2022-08-05 NOTE — Assessment & Plan Note (Signed)
Chronic Stressed the importance of weight loss Encouraged regular exercise Encourage decrease portions, diabetic diet

## 2022-08-05 NOTE — Assessment & Plan Note (Signed)
Given risk factors will screen for heart disease with CT coronary artery calcium score Blood pressure well-controlled Lipids controlled Encouraged regular exercise Further evaluation depending on results

## 2022-08-05 NOTE — Assessment & Plan Note (Signed)
Chronic ?Regular exercise and healthy diet encouraged ?Check lipid panel  ?Continue pravastatin 20 mg daily ?

## 2022-08-05 NOTE — Assessment & Plan Note (Signed)
Chronic °Blood pressure well controlled °CMP °Continue amlodipine 5 mg daily, benazepril 20 mg daily, spironolactone 25 mg daily °

## 2022-08-05 NOTE — Assessment & Plan Note (Signed)
Chronic   Lab Results  Component Value Date   HGBA1C 7.7 (H) 01/07/2022   Sugars not ideally controlled Check A1c Continue metformin xr 1500 mg daily, jardiance 25 mg dialy Stressed the importance of exercising regularly, weight loss and better compliance with a diabetic diet

## 2022-08-05 NOTE — Assessment & Plan Note (Signed)
Chronic GERD controlled Continue pantoprazole 40 mg daily as needed 

## 2022-08-05 NOTE — Assessment & Plan Note (Signed)
Subacute Having left shoulder pain for months after fall on the stairs and landing on his left shoulder X-ray today Will likely need to see orthopedics

## 2022-08-05 NOTE — Assessment & Plan Note (Signed)
Chronic left knee osteoarthritis with pain Knows at some point he is going to need to have this replaced Has already had right knee replaced Will follow-up with orthopedics as needed

## 2022-08-12 ENCOUNTER — Ambulatory Visit
Admission: RE | Admit: 2022-08-12 | Discharge: 2022-08-12 | Disposition: A | Discharge status: Home / Self Care | Payer: 59 | Source: Ambulatory Visit | Attending: Internal Medicine | Admitting: Internal Medicine

## 2022-08-12 DIAGNOSIS — Z136 Encounter for screening for cardiovascular disorders: Secondary | ICD-10-CM | POA: Insufficient documentation

## 2022-08-16 ENCOUNTER — Encounter: Payer: Self-pay | Admitting: Internal Medicine

## 2022-08-16 DIAGNOSIS — R931 Abnormal findings on diagnostic imaging of heart and coronary circulation: Secondary | ICD-10-CM | POA: Insufficient documentation

## 2022-09-11 ENCOUNTER — Other Ambulatory Visit: Payer: Self-pay | Admitting: Internal Medicine

## 2022-09-20 ENCOUNTER — Other Ambulatory Visit: Payer: Self-pay | Admitting: Internal Medicine

## 2022-11-05 ENCOUNTER — Other Ambulatory Visit: Payer: Self-pay | Admitting: Internal Medicine

## 2022-11-09 ENCOUNTER — Other Ambulatory Visit: Payer: Self-pay | Admitting: Internal Medicine

## 2023-01-12 ENCOUNTER — Other Ambulatory Visit: Payer: Self-pay | Admitting: Internal Medicine

## 2023-03-28 ENCOUNTER — Other Ambulatory Visit: Payer: Self-pay | Admitting: Internal Medicine

## 2023-04-22 ENCOUNTER — Other Ambulatory Visit: Payer: Self-pay | Admitting: Internal Medicine

## 2023-04-29 ENCOUNTER — Other Ambulatory Visit: Payer: Self-pay | Admitting: Internal Medicine

## 2023-05-22 ENCOUNTER — Other Ambulatory Visit: Payer: Self-pay | Admitting: Internal Medicine

## 2023-05-31 ENCOUNTER — Other Ambulatory Visit: Payer: Self-pay | Admitting: Internal Medicine

## 2023-06-25 ENCOUNTER — Other Ambulatory Visit: Payer: Self-pay | Admitting: Internal Medicine

## 2023-07-17 ENCOUNTER — Other Ambulatory Visit: Payer: Self-pay | Admitting: Internal Medicine

## 2023-07-19 ENCOUNTER — Other Ambulatory Visit: Payer: Self-pay | Admitting: Internal Medicine

## 2023-07-21 ENCOUNTER — Other Ambulatory Visit: Payer: Self-pay | Admitting: Internal Medicine

## 2023-07-30 ENCOUNTER — Other Ambulatory Visit: Payer: Self-pay | Admitting: Internal Medicine

## 2023-08-11 ENCOUNTER — Other Ambulatory Visit: Payer: Self-pay | Admitting: Internal Medicine

## 2023-08-14 ENCOUNTER — Other Ambulatory Visit: Payer: Self-pay | Admitting: Internal Medicine

## 2023-08-15 ENCOUNTER — Other Ambulatory Visit: Payer: Self-pay | Admitting: Internal Medicine

## 2023-08-17 ENCOUNTER — Encounter: Payer: Self-pay | Admitting: Internal Medicine

## 2023-08-17 DIAGNOSIS — I2510 Atherosclerotic heart disease of native coronary artery without angina pectoris: Secondary | ICD-10-CM | POA: Insufficient documentation

## 2023-08-17 NOTE — Patient Instructions (Addendum)
 Blood work was ordered.       Medications changes include :   None    A referral was ordered and someone will call you to schedule an appointment.     Return in about 6 months (around 02/17/2024) for follow up.   Health Maintenance, Male Adopting a healthy lifestyle and getting preventive care are important in promoting health and wellness. Ask your health care provider about: The right schedule for you to have regular tests and exams. Things you can do on your own to prevent diseases and keep yourself healthy. What should I know about diet, weight, and exercise? Eat a healthy diet  Eat a diet that includes plenty of vegetables, fruits, low-fat dairy products, and lean protein. Do not eat a lot of foods that are high in solid fats, added sugars, or sodium. Maintain a healthy weight Body mass index (BMI) is a measurement that can be used to identify possible weight problems. It estimates body fat based on height and weight. Your health care provider can help determine your BMI and help you achieve or maintain a healthy weight. Get regular exercise Get regular exercise. This is one of the most important things you can do for your health. Most adults should: Exercise for at least 150 minutes each week. The exercise should increase your heart rate and make you sweat (moderate-intensity exercise). Do strengthening exercises at least twice a week. This is in addition to the moderate-intensity exercise. Spend less time sitting. Even light physical activity can be beneficial. Watch cholesterol and blood lipids Have your blood tested for lipids and cholesterol at 57 years of age, then have this test every 5 years. You may need to have your cholesterol levels checked more often if: Your lipid or cholesterol levels are high. You are older than 57 years of age. You are at high risk for heart disease. What should I know about cancer screening? Many types of cancers can be detected  early and may often be prevented. Depending on your health history and family history, you may need to have cancer screening at various ages. This may include screening for: Colorectal cancer. Prostate cancer. Skin cancer. Lung cancer. What should I know about heart disease, diabetes, and high blood pressure? Blood pressure and heart disease High blood pressure causes heart disease and increases the risk of stroke. This is more likely to develop in people who have high blood pressure readings or are overweight. Talk with your health care provider about your target blood pressure readings. Have your blood pressure checked: Every 3-5 years if you are 5-44 years of age. Every year if you are 55 years old or older. If you are between the ages of 80 and 62 and are a current or former smoker, ask your health care provider if you should have a one-time screening for abdominal aortic aneurysm (AAA). Diabetes Have regular diabetes screenings. This checks your fasting blood sugar level. Have the screening done: Once every three years after age 3 if you are at a normal weight and have a low risk for diabetes. More often and at a younger age if you are overweight or have a high risk for diabetes. What should I know about preventing infection? Hepatitis B If you have a higher risk for hepatitis B, you should be screened for this virus. Talk with your health care provider to find out if you are at risk for hepatitis B infection. Hepatitis C Blood testing is recommended for:  Everyone born from [birth year] through 1965. Anyone with known risk factors for hepatitis C. Sexually transmitted infections (STIs) You should be screened each year for STIs, including gonorrhea and chlamydia, if: You are sexually active and are younger than 57 years of age. You are older than 57 years of age and your health care provider tells you that you are at risk for this type of infection. Your sexual activity has changed since  you were last screened, and you are at increased risk for chlamydia or gonorrhea. Ask your health care provider if you are at risk. Ask your health care provider about whether you are at high risk for HIV. Your health care provider may recommend a prescription medicine to help prevent HIV infection. If you choose to take medicine to prevent HIV, you should first get tested for HIV. You should then be tested every 3 months for as long as you are taking the medicine. Follow these instructions at home: Alcohol use Do not drink alcohol if your health care provider tells you not to drink. If you drink alcohol: Limit how much you have to 0-2 drinks a day. Know how much alcohol is in your drink. In the [place] one drink equals one 12 oz bottle of beer (355 mL), one 5 oz glass of wine (148 mL), or one 1 oz glass of hard liquor (44 mL). Lifestyle Do not use any products that contain nicotine or tobacco. These products include cigarettes, chewing tobacco, and vaping devices, such as e-cigarettes. If you need help quitting, ask your health care provider. Do not use street drugs. Do not share needles. Ask your health care provider for help if you need support or information about quitting drugs. General instructions Schedule regular health, dental, and eye exams. Stay current with your vaccines. Tell your health care provider if: You often feel depressed. You have ever been abused or do not feel safe at home. Summary Adopting a healthy lifestyle and getting preventive care are important in promoting health and wellness. Follow your health care provider's instructions about healthy diet, exercising, and getting tested or screened for diseases. Follow your health care provider's instructions on monitoring your cholesterol and blood pressure. This information is not intended to replace advice given to you by your health care provider. Make sure you discuss any questions you have with your health care  provider. Document Revised: 09/16/2020 Document Reviewed: 09/16/2020 Elsevier Patient Education  2024 [employer].

## 2023-08-17 NOTE — Progress Notes (Unsigned)
 Subjective:    Patient ID: Benjamin Chen, male    DOB: 1965/06/09, 58 y.o.   MRN: [id number]     HPI [name] is here for a physical exam and his chronic medical problems.   He has not been exercising.  He has not been eating how he should be eating and he is afraid his sugars are can be higher than they were.  He knows what he is supposed to be doing, but has not been doing it.  The few times he has checked his sugar was 134-200's.  Occ does not feel good and has vision changes at times, also has increased urination.   Medications and allergies reviewed with patient and updated if appropriate.  Current Outpatient Medications on File Prior to Visit  Medication Sig Dispense Refill   amLODipine (NORVASC) 5 MG tablet TAKE 1 TABLET (5 MG TOTAL) BY MOUTH DAILY. NEEDS OFFICE VISIT FOR REFILLS. 30 tablet 0   ASPIRIN 81 PO Take by mouth.     benazepril (LOTENSIN) 20 MG tablet TAKE 1 TABLET (20 MG TOTAL) BY MOUTH DAILY. NEEDS OFFICE VISIT FOR FURTHER REFILLS. 30 tablet 0   JARDIANCE 25 MG TABS tablet TAKE 1 TABLET BY MOUTH EVERY DAY BEFORE BREAKFAST 30 tablet 0   loratadine (CLARITIN) 10 MG tablet Take 1 tablet by mouth daily.     metFORMIN (GLUCOPHAGE-XR) 750 MG 24 hr tablet TAKE 2 TABLETS (1,500 MG TOTAL) BY MOUTH EVERY DAY WITH BREAKFAST. NEEDS OFFICE VISIT FOR REFILLS. 60 tablet 0   pravastatin (PRAVACHOL) 20 MG tablet TAKE 1 TABLET BY MOUTH EVERY DAY 90 tablet 3   spironolactone (ALDACTONE) 25 MG tablet TAKE 1 TABLET BY MOUTH EVERY DAY 30 tablet 0   No current facility-administered medications on file prior to visit.    Review of Systems  Constitutional:  Negative for fever.  Eyes:  Positive for visual disturbance (at times blurry vision - eye exam up to date).  Respiratory:  Positive for shortness of breath (at rest or when laying down - not related to exertion). Negative for cough and wheezing.   Cardiovascular:  Negative for chest pain, palpitations and leg swelling.   Gastrointestinal:  Negative for abdominal pain, blood in stool, constipation and diarrhea.       Occ gerd  Genitourinary:  Negative for difficulty urinating and dysuria.  Musculoskeletal:  Positive for arthralgias (left shoulder, right knee). Negative for back pain.  Skin:  Negative for rash.  Neurological:  Negative for light-headedness and headaches.  Psychiatric/Behavioral:  Positive for dysphoric mood (seeing a therapist). The patient is not nervous/anxious.        Objective:   Vitals:   08/18/23 1301  BP: 124/70  Pulse: 78  Temp: 98.4 F (36.9 C)  SpO2: 97%   Filed Weights   08/18/23 1301  Weight: 272 lb (123.4 kg)   Body mass index is 35.4 kg/m.  BP Readings from Last 3 Encounters:  08/18/23 124/70  08/05/22 122/74  01/07/22 120/82    Wt Readings from Last 3 Encounters:  08/18/23 272 lb (123.4 kg)  08/05/22 258 lb (117 kg)  01/07/22 269 lb (122 kg)      Physical Exam Constitutional: He appears well-developed and well-nourished. No distress.  HENT:  Head: Normocephalic and atraumatic.  Right Ear: External ear normal.  Left Ear: External ear normal.  Normal ear canals and TM b/l  Mouth/Throat: Oropharynx is clear and moist. Eyes: Conjunctivae and EOM are normal.  Neck: Neck supple. No tracheal  deviation present. No thyromegaly present.  No carotid bruit  Cardiovascular: Normal rate, regular rhythm, normal heart sounds and intact distal pulses.   No murmur heard.  No lower extremity edema. Pulmonary/Chest: Effort normal and breath sounds normal. No respiratory distress. He has no wheezes. He has no rales.  Abdominal: Soft. He exhibits no distension. There is no tenderness.  Genitourinary: deferred  Lymphadenopathy:   He has no cervical adenopathy.  Skin: Skin is warm and dry. He is not diaphoretic.  Psychiatric: He has a normal mood and affect. His behavior is normal.         Assessment & Plan:   Physical exam: Screening blood work   ordered Exercise   none - will start walking Weight  obese - stressed  weight loss Substance abuse   none   Reviewed recommended immunizations.   Health Maintenance  Topic Date Due   FOOT EXAM  02/23/2017   OPHTHALMOLOGY EXAM  07/20/2020   HEMOGLOBIN A1C  02/05/2023   Diabetic kidney evaluation - eGFR measurement  08/05/2023   Diabetic kidney evaluation - Urine ACR  08/05/2023   COVID-19 Vaccine (3 - 2024-25 season) 09/03/2023 (Originally 01/10/2023)   Pneumococcal Vaccine 55-58 Years old (1 of 2 - PCV) 08/17/2024 (Originally 04/16/1972)   INFLUENZA VACCINE  12/10/2023   Colonoscopy  10/18/2025   DTaP/Tdap/Td (2 - Td or Tdap) 10/14/2027   Hepatitis C Screening  Completed   HIV Screening  Completed   Zoster Vaccines- Shingrix  Completed   HPV VACCINES  Aged Out     See Problem List for Assessment and Plan of chronic medical problems.

## 2023-08-18 ENCOUNTER — Ambulatory Visit (INDEPENDENT_AMBULATORY_CARE_PROVIDER_SITE_OTHER): Admitting: Internal Medicine

## 2023-08-18 VITALS — BP 124/70 | HR 78 | Temp 98.4°F | Ht 73.5 in | Wt 272.0 lb

## 2023-08-18 DIAGNOSIS — K760 Fatty (change of) liver, not elsewhere classified: Secondary | ICD-10-CM

## 2023-08-18 DIAGNOSIS — Z0000 Encounter for general adult medical examination without abnormal findings: Secondary | ICD-10-CM

## 2023-08-18 DIAGNOSIS — Z7984 Long term (current) use of oral hypoglycemic drugs: Secondary | ICD-10-CM

## 2023-08-18 DIAGNOSIS — M109 Gout, unspecified: Secondary | ICD-10-CM

## 2023-08-18 DIAGNOSIS — E1169 Type 2 diabetes mellitus with other specified complication: Secondary | ICD-10-CM | POA: Diagnosis not present

## 2023-08-18 DIAGNOSIS — G4733 Obstructive sleep apnea (adult) (pediatric): Secondary | ICD-10-CM | POA: Diagnosis not present

## 2023-08-18 DIAGNOSIS — I10 Essential (primary) hypertension: Secondary | ICD-10-CM | POA: Diagnosis not present

## 2023-08-18 DIAGNOSIS — I2510 Atherosclerotic heart disease of native coronary artery without angina pectoris: Secondary | ICD-10-CM

## 2023-08-18 DIAGNOSIS — Z125 Encounter for screening for malignant neoplasm of prostate: Secondary | ICD-10-CM | POA: Diagnosis not present

## 2023-08-18 DIAGNOSIS — E782 Mixed hyperlipidemia: Secondary | ICD-10-CM | POA: Diagnosis not present

## 2023-08-18 DIAGNOSIS — K219 Gastro-esophageal reflux disease without esophagitis: Secondary | ICD-10-CM

## 2023-08-18 LAB — LIPID PANEL
Cholesterol: 157 mg/dL (ref 0–200)
HDL: 28.3 mg/dL — ABNORMAL LOW (ref >39.00)
LDL Cholesterol: 79 mg/dL (ref 0–99)
NonHDL: 128.57
Total CHOL/HDL Ratio: 6
Triglycerides: 248 mg/dL — ABNORMAL HIGH (ref 0.0–149.0)
VLDL: 49.6 mg/dL — ABNORMAL HIGH (ref 0.0–40.0)

## 2023-08-18 LAB — MICROALBUMIN / CREATININE URINE RATIO
Creatinine,U: 93.2 mg/dL
Microalb Creat Ratio: UNDETERMINED mg/g (ref 0.0–30.0)
Microalb, Ur: <0.7 mg/dL

## 2023-08-18 LAB — CBC WITH DIFFERENTIAL/PLATELET
Basophils Absolute: 0.1 10*3/uL (ref 0.0–0.1)
Basophils Relative: 0.9 % (ref 0.0–3.0)
Eosinophils Absolute: 0.3 10*3/uL (ref 0.0–0.7)
Eosinophils Relative: 3.4 % (ref 0.0–5.0)
HCT: 47 % (ref 39.0–52.0)
Hemoglobin: 15.9 g/dL (ref 13.0–17.0)
Lymphocytes Relative: 32.1 % (ref 12.0–46.0)
Lymphs Abs: 2.8 10*3/uL (ref 0.7–4.0)
MCHC: 33.8 g/dL (ref 30.0–36.0)
MCV: 79.6 fl (ref 78.0–100.0)
Monocytes Absolute: 0.7 10*3/uL (ref 0.1–1.0)
Monocytes Relative: 8.4 % (ref 3.0–12.0)
Neutro Abs: 4.8 10*3/uL (ref 1.4–7.7)
Neutrophils Relative %: 55.2 % (ref 43.0–77.0)
Platelets: 360 10*3/uL (ref 150.0–400.0)
RBC: 5.9 Mil/uL — ABNORMAL HIGH (ref 4.22–5.81)
RDW: 15.4 % (ref 11.5–15.5)
WBC: 8.7 10*3/uL (ref 4.0–10.5)

## 2023-08-18 LAB — COMPREHENSIVE METABOLIC PANEL WITH GFR
ALT: 29 U/L (ref 0–53)
AST: 18 U/L (ref 0–37)
Albumin: 4.4 g/dL (ref 3.5–5.2)
Alkaline Phosphatase: 88 U/L (ref 39–117)
BUN: 12 mg/dL (ref 6–23)
CO2: 28 meq/L (ref 19–32)
Calcium: 9.9 mg/dL (ref 8.4–10.5)
Chloride: 101 meq/L (ref 96–112)
Creatinine, Ser: 1.1 mg/dL (ref 0.40–1.50)
GFR: 75.13 mL/min (ref >60.00)
Glucose, Bld: 209 mg/dL — ABNORMAL HIGH (ref 70–99)
Potassium: 4.5 meq/L (ref 3.5–5.1)
Sodium: 137 meq/L (ref 135–145)
Total Bilirubin: 1 mg/dL (ref 0.2–1.2)
Total Protein: 6.7 g/dL (ref 6.0–8.3)

## 2023-08-18 LAB — HEMOGLOBIN A1C: Hgb A1c MFr Bld: 7.8 % — ABNORMAL HIGH (ref 4.6–6.5)

## 2023-08-18 LAB — PSA, MEDICARE: PSA: 0.44 ng/mL (ref 0.10–4.00)

## 2023-08-18 MED ORDER — EMPAGLIFLOZIN 25 MG PO TABS: ORAL_TABLET | ORAL | 1 refills | Status: DC

## 2023-08-18 MED ORDER — METFORMIN HCL ER 750 MG PO TB24: ORAL_TABLET | ORAL | 1 refills | Status: DC

## 2023-08-18 NOTE — Assessment & Plan Note (Signed)
 Chronic Blood pressure well controlled CMP, cbc Continue amlodipine 5 mg daily, benazepril 20 mg daily, spironolactone 25 mg daily

## 2023-08-18 NOTE — Assessment & Plan Note (Signed)
Chronic ?Regular exercise and healthy diet encouraged ?Check lipid panel  ?Continue pravastatin 20 mg daily ?

## 2023-08-18 NOTE — Assessment & Plan Note (Addendum)
 Chronic Stressed the importance of weight loss Encouraged regular exercise Encourage decrease portions, diabetic diet If sugars are not well-controlled he states he would consider Mounjaro because he knows he needs to also lose weight

## 2023-08-18 NOTE — Assessment & Plan Note (Addendum)
 Chronic Ct cac 249 in 2024 BP controlled Sugars not ideally controlled last visit He denies symptoms c/w angina Stressed regular exercise, healthy diet, weight loss Continue pravastatin 20 mg daily

## 2023-08-18 NOTE — Assessment & Plan Note (Addendum)
 Chronic GERD controlled - occasionally Stopped pantoprazole 40 mg daily Stressed making sure the heartburn is controlled

## 2023-08-18 NOTE — Assessment & Plan Note (Addendum)
 Uric acid level has been well controlled occasional flares Stopped allopurinol He thinks most of his flares have been related to drinking Coke 0-encouraged him decreasing that is much as possible.

## 2023-08-18 NOTE — Assessment & Plan Note (Addendum)
 Chronic  Lab Results  Component Value Date   HGBA1C 7.4 (H) 08/05/2022   Sugars not ideally controlled Check A1c, urine microalbumin Continue metformin xr 1500 mg daily, jardiance 25 mg daily Stressed the importance of exercising regularly, weight loss and better compliance with a diabetic diet If A1c is not controlled he states he would consider trying [facility]

## 2023-08-18 NOTE — Assessment & Plan Note (Addendum)
 Chronic  Using cpap nightly - tolerating it better since getting a new mask Has seen some improvement in energy

## 2023-08-18 NOTE — Assessment & Plan Note (Addendum)
 Chronic Stressed regular exercise, healthy diet and weight loss Discussed increased risk for developing liver cirrhosis cmp

## 2023-08-21 ENCOUNTER — Encounter: Payer: Self-pay | Admitting: Internal Medicine

## 2023-08-21 MED ORDER — TIRZEPATIDE 2.5 MG/0.5ML ~~LOC~~ SOAJ: 2.5000 mg | SUBCUTANEOUS | 0 refills | Status: AC

## 2023-08-21 NOTE — Addendum Note (Signed)
 Addended by: Colene Dauphin on: 08/21/2023 10:06 AM   Modules accepted: Orders

## 2023-09-13 ENCOUNTER — Other Ambulatory Visit: Payer: Self-pay | Admitting: Internal Medicine

## 2023-09-17 ENCOUNTER — Other Ambulatory Visit: Payer: Self-pay | Admitting: Internal Medicine

## 2023-09-20 ENCOUNTER — Other Ambulatory Visit: Payer: Self-pay | Admitting: Internal Medicine

## 2024-03-12 ENCOUNTER — Other Ambulatory Visit: Payer: Self-pay | Admitting: Internal Medicine
